# Patient Record
Sex: Female | Born: 1970 | Race: White | Hispanic: No | Marital: Married | Smoking: Never smoker
Health system: Southern US, Community
[De-identification: ages and names within clinical notes are randomized; demographics above are authoritative.]

## PROBLEM LIST (undated history)

## (undated) DIAGNOSIS — R7303 Prediabetes: Secondary | ICD-10-CM

## (undated) DIAGNOSIS — Z975 Presence of (intrauterine) contraceptive device: Secondary | ICD-10-CM

## (undated) DIAGNOSIS — G932 Benign intracranial hypertension: Secondary | ICD-10-CM

## (undated) HISTORY — DX: Presence of (intrauterine) contraceptive device: Z97.5

## (undated) HISTORY — DX: Prediabetes: R73.03

## (undated) HISTORY — PX: ADENOIDECTOMY: SUR15

## (undated) HISTORY — PX: TONSILLECTOMY: SUR1361

---

## 1999-08-25 ENCOUNTER — Other Ambulatory Visit: Admission: RE | Admit: 1999-08-25 | Discharge: 1999-08-25 | Payer: Self-pay | Admitting: Obstetrics and Gynecology

## 2000-01-12 ENCOUNTER — Other Ambulatory Visit: Admission: RE | Admit: 2000-01-12 | Discharge: 2000-01-12 | Payer: Self-pay | Admitting: Obstetrics and Gynecology

## 2000-02-17 ENCOUNTER — Ambulatory Visit (HOSPITAL_COMMUNITY): Admission: RE | Admit: 2000-02-17 | Discharge: 2000-02-17 | Payer: Self-pay | Admitting: *Deleted

## 2000-02-17 ENCOUNTER — Encounter: Payer: Self-pay | Admitting: *Deleted

## 2000-03-04 ENCOUNTER — Inpatient Hospital Stay (HOSPITAL_COMMUNITY): Admission: AD | Admit: 2000-03-04 | Discharge: 2000-03-06 | Payer: Self-pay | Admitting: Obstetrics and Gynecology

## 2000-07-05 ENCOUNTER — Other Ambulatory Visit: Admission: RE | Admit: 2000-07-05 | Discharge: 2000-07-05 | Payer: Self-pay | Admitting: *Deleted

## 2000-07-05 ENCOUNTER — Encounter (INDEPENDENT_AMBULATORY_CARE_PROVIDER_SITE_OTHER): Payer: Self-pay | Admitting: Specialist

## 2001-05-29 ENCOUNTER — Other Ambulatory Visit: Admission: RE | Admit: 2001-05-29 | Discharge: 2001-05-29 | Payer: Self-pay | Admitting: Obstetrics and Gynecology

## 2002-12-17 ENCOUNTER — Other Ambulatory Visit: Admission: RE | Admit: 2002-12-17 | Discharge: 2002-12-17 | Payer: Self-pay | Admitting: Obstetrics and Gynecology

## 2004-02-05 ENCOUNTER — Other Ambulatory Visit: Admission: RE | Admit: 2004-02-05 | Discharge: 2004-02-05 | Payer: Self-pay | Admitting: Obstetrics and Gynecology

## 2005-04-21 ENCOUNTER — Ambulatory Visit: Payer: Self-pay | Admitting: Family Medicine

## 2005-06-08 ENCOUNTER — Ambulatory Visit: Payer: Self-pay | Admitting: Family Medicine

## 2005-06-11 ENCOUNTER — Ambulatory Visit: Payer: Self-pay | Admitting: Family Medicine

## 2005-09-13 ENCOUNTER — Ambulatory Visit: Payer: Self-pay | Admitting: Family Medicine

## 2005-12-31 ENCOUNTER — Ambulatory Visit: Payer: Self-pay | Admitting: Family Medicine

## 2006-06-27 ENCOUNTER — Ambulatory Visit: Payer: Self-pay | Admitting: Family Medicine

## 2007-01-25 ENCOUNTER — Other Ambulatory Visit: Admission: RE | Admit: 2007-01-25 | Discharge: 2007-01-25 | Payer: Self-pay | Admitting: Obstetrics and Gynecology

## 2007-11-08 DIAGNOSIS — F411 Generalized anxiety disorder: Secondary | ICD-10-CM | POA: Insufficient documentation

## 2008-01-31 ENCOUNTER — Other Ambulatory Visit: Admission: RE | Admit: 2008-01-31 | Discharge: 2008-01-31 | Payer: Self-pay | Admitting: Obstetrics and Gynecology

## 2010-06-11 DIAGNOSIS — Z975 Presence of (intrauterine) contraceptive device: Secondary | ICD-10-CM

## 2010-06-11 HISTORY — DX: Presence of (intrauterine) contraceptive device: Z97.5

## 2011-02-26 NOTE — H&P (Signed)
[facility]  Patient:    Kelsey Pope, Kelsey Pope                       MRN: [id number] Adm. Date:  [id number] Attending:  Shaune Spittle Dictator:   Vance Gather Duplantis, C.N.M.                         History and Physical  HISTORY OF PRESENT ILLNESS:       Ms. Chestnutt is a 40 year old, married white female, gravida 2, para 1-0-0-1, at 37-2/7 weeks who presents complaining of contractions every 5 to 10 minutes with uterine irritability in between.  She denies any leaking, bleeding, or vaginal discharge.  She reports positive fetal movement.  She denies any nausea, vomiting, headache, or visual disturbances.  She does report increased back pain and pressure over the last several days and an episode of frequent contractions approximately one week ago.  Her pregnancy has been followed at [facility] by the certified nurse midwife service and has been essentially uncomplicated though at risk for first trimester spotting and a history of abnormal Pap during this pregnancy and received a colposcopy.  OB/GYN HISTORY:                   She is a gravida 2, para 1-0-0-1, who delivered a viable female infant in 1998 who weighed 7 pounds 11 ounces at [redacted] weeks gestation following a 12-hour labor.  She reports that she labored and progressed quickly to complete but required several hours of pushing prior to being delivered.  ALLERGIES:                        CODEINE gives her a feeling of irritability.  PAST MEDICAL HISTORY:             Usual childhood diseases.  She reports occasional urinary tract infections.  PAST SURGICAL HISTORY:            Includes tonsillectomy at age 21 and wisdom teeth removed in high school.  FAMILY HISTORY:                   Significant for maternal grandfather and maternal grandmother with stroke, maternal aunt with varicose veins, mother with thyroid problems, maternal grandmother and maternal aunt with  breast cancer.  GENETIC HISTORY:                  Negative with the exception of the father of the babys cousin with spina bifida and maternal cousin with a "hole in the heart."  SOCIAL HISTORY:                   She is married to [employer] who is involved and supportive.  He is self employed, and she is also employed full time. They are of the [facility] faith.  They deny illicit drug use, alcohol, or smoking with this pregnancy.  PRENATAL LABORATORY DATA:         Blood type is A positive.  Her antibody screen is negative, syphilis nonreactive, rubella immune, hepatitis B surface antigen negative.  TSH have been within normal range throughout her pregnancy. GC and chlamydia are within normal range.  She had low-grade SIL on her first Pap and had a colposcopy during her pregnancy.  Her one-hour glucola was within normal range, and maternal serum alpha-fetoprotein was also within normal range.  Her 36-week beta Strep was positive.  PHYSICAL EXAMINATION:  VITAL SIGNS:                      Stable.  She is afebrile.  HEENT:                            Grossly within normal limits.  HEART:                            Regular rhythm and rate.  CHEST:                            Clear.  BREASTS:                          Soft and nontender.  ABDOMEN:                          Gravid with uterine contractions every 5 to 10 minutes with mild irritability in between.  Fetal heart rate is reactive and reassuring.  PELVIC:                           Her cervix on admission was about 4 cm, 75% vertex, -1, and intact membranes.  EXTREMITIES:                      Within normal limits.  ASSESSMENT:                       1. Intrauterine pregnancy at term.                                   2. Early labor.                                   3. Positive group B Streptococcus.  PLAN:                             Admit to labor and delivery at [facility].  Dr. Dierdre Forth has been notified of  patients admission, and patient is to follow routine CNM orders and receive penicillin for group B Strep prophylaxis. DD:  03/04/00 TD:  03/04/00 Job: [id number]

## 2014-03-29 ENCOUNTER — Other Ambulatory Visit: Payer: Self-pay

## 2014-03-29 DIAGNOSIS — Z1231 Encounter for screening mammogram for malignant neoplasm of breast: Secondary | ICD-10-CM

## 2014-04-11 ENCOUNTER — Ambulatory Visit
Admission: RE | Admit: 2014-04-11 | Discharge: 2014-04-11 | Disposition: A | Discharge status: Home / Self Care | Payer: 59 | Source: Ambulatory Visit

## 2014-04-11 ENCOUNTER — Encounter (INDEPENDENT_AMBULATORY_CARE_PROVIDER_SITE_OTHER): Payer: Self-pay

## 2014-04-11 DIAGNOSIS — Z1231 Encounter for screening mammogram for malignant neoplasm of breast: Secondary | ICD-10-CM

## 2014-04-17 ENCOUNTER — Ambulatory Visit: Payer: Self-pay | Admitting: Gynecology

## 2014-05-17 ENCOUNTER — Encounter: Payer: Self-pay | Admitting: Obstetrics and Gynecology

## 2014-05-20 ENCOUNTER — Ambulatory Visit (INDEPENDENT_AMBULATORY_CARE_PROVIDER_SITE_OTHER): Payer: 59 | Admitting: Gynecology

## 2014-05-20 ENCOUNTER — Encounter: Payer: Self-pay | Admitting: Gynecology

## 2014-05-20 VITALS — BP 110/66 | HR 64 | Resp 18 | Ht 59.75 in | Wt 148.0 lb

## 2014-05-20 DIAGNOSIS — Z01419 Encounter for gynecological examination (general) (routine) without abnormal findings: Secondary | ICD-10-CM

## 2014-05-20 DIAGNOSIS — Z13220 Encounter for screening for lipoid disorders: Secondary | ICD-10-CM

## 2014-05-20 DIAGNOSIS — Z0000 Encounter for general adult medical examination without abnormal findings: Secondary | ICD-10-CM

## 2014-05-20 DIAGNOSIS — Z124 Encounter for screening for malignant neoplasm of cervix: Secondary | ICD-10-CM

## 2014-05-20 LAB — POCT URINALYSIS DIPSTICK
Urobilinogen, UA: NEGATIVE
pH, UA: 5

## 2014-05-20 LAB — HEMOGLOBIN, FINGERSTICK: HEMOGLOBIN, FINGERSTICK: 14 g/dL (ref 12.0–16.0)

## 2014-05-20 NOTE — Progress Notes (Signed)
43 y.o. Married Caucasian female   G2P2002 here for annual exam. Pt is currently sexually active.  Pt has been amenorrheic on Mirena initially placed 2001. Pt reports 58m history of feeling hot at night and weight gain.    Patient's last menstrual period was 04/29/2014.          Sexually active: Yes.    The current method of family planning is IUD.    Exercising: Yes.    walking, aerobics 2x/wk Last pap: 06/01/10 NEG  Alcohol: 2 drinks/wk Tobacco: no BSE: yes Mammogram: 04/16/14 Bi-Rads 1  Hgb: 14.0 ; Urine: Leuks 1  Health Maintenance  Topic Date Due  . Pap Smear  04/06/1988  . Influenza Vaccine  05/11/2014  . Tetanus/tdap  01/24/2017    Family History  Problem Relation Age of Onset  . Breast cancer Maternal Grandmother     w/ menopause  . Breast cancer Maternal Grandmother 48    Patient Active Problem List   Diagnosis Date Noted  . ANXIETY 11/08/2007    Past Medical History  Diagnosis Date  . IUD (intrauterine device) in place 9/11    History reviewed. No pertinent past surgical history.  Allergies: Codeine  Current Outpatient Prescriptions  Medication Sig Dispense Refill  . Ascorbic Acid (VITAMIN C PO) Take by mouth.      . Calcium-Vitamin D-Vitamin K (CHEWABLE CALCIUM PO) Take by mouth.      . doxycycline (ORACEA) 40 MG capsule Take 40 mg by mouth every morning.      Marland Kitchen levonorgestrel (MIRENA) 20 MCG/24HR IUD 1 each by Intrauterine route once.      . Multiple Vitamins-Minerals (MULTIVITAMIN PO) Take by mouth.      . spironolactone (ALDACTONE) 50 MG tablet Take 50 mg by mouth daily.       No current facility-administered medications for this visit.    ROS: Pertinent items are noted in HPI.  Exam:    BP 110/66  Pulse 64  Resp 18  Ht 4' 11.75" (1.518 m)  Wt 148 lb (67.132 kg)  BMI 29.13 kg/m2  LMP 04/29/2014 Weight change: @WEIGHTCHANGE @ Last 3 height recordings:  Ht Readings from Last 3 Encounters:  05/20/14 4' 11.75" (1.518 m)   General appearance:  alert, cooperative and appears stated age Head: Normocephalic, without obvious abnormality, atraumatic Neck: no adenopathy, no carotid bruit, no JVD, supple, symmetrical, trachea midline and thyroid not enlarged, symmetric, no tenderness/mass/nodules Lungs: clear to auscultation bilaterally Breasts: normal appearance, no masses or tenderness Heart: regular rate and rhythm, S1, S2 normal, no murmur, click, rub or gallop Abdomen: soft, non-tender; bowel sounds normal; no masses,  no organomegaly Extremities: extremities normal, atraumatic, no cyanosis or edema Skin: Skin color, texture, turgor normal. No rashes or lesions Lymph nodes: Cervical, supraclavicular, and axillary nodes normal. no inguinal nodes palpated Neurologic: Grossly normal   Pelvic: External genitalia:  no lesions              Urethra: normal appearing urethra with no masses, tenderness or lesions              Bartholins and Skenes: Bartholin's, Urethra, Skene's normal                 Vagina: normal appearing vagina with normal color and discharge, no lesions              Cervix: normal appearance, strings noted              Pap taken: Yes.  Bimanual Exam:  Uterus:  uterus is normal size, shape, consistency and nontender                                      Adnexa:    not indicated and normal adnexa in size, nontender and no masses                                      Rectovaginal: Confirms                                      Anus:  normal sphincter tone, no lesions  1. Routine gynecological examination  counseled on breast self exam, mammography screening, adequate intake of calcium and vitamin D, diet and exercise return annually or prn  2. Laboratory examination ordered as part of a routine general medical examination  - POCT Urinalysis Dipstick - Hemoglobin, fingerstick - Comprehensive metabolic panel; Future  3. Screening for cervical cancer New guidelines reviewed, discussed addition of HPV to pap -  Pap Test with HP (IPS)  4. Screening, lipid Will rto - Lipid panel; Future  5. IUD in place Pt with 3rd IUD, needs to be removed and replaced next year, very happy with no menses An After Visit Summary was printed and given to the patient.

## 2014-05-22 LAB — IPS PAP TEST WITH HPV

## 2014-05-28 ENCOUNTER — Other Ambulatory Visit: Payer: Self-pay | Admitting: Gynecology

## 2014-05-28 ENCOUNTER — Other Ambulatory Visit (INDEPENDENT_AMBULATORY_CARE_PROVIDER_SITE_OTHER): Payer: 59

## 2014-05-28 DIAGNOSIS — Z13220 Encounter for screening for lipoid disorders: Secondary | ICD-10-CM

## 2014-05-28 DIAGNOSIS — Z0000 Encounter for general adult medical examination without abnormal findings: Secondary | ICD-10-CM

## 2014-05-28 LAB — COMPREHENSIVE METABOLIC PANEL
ALK PHOS: 59 U/L (ref 39–117)
ALT: 12 U/L (ref 0–35)
AST: 15 U/L (ref 0–37)
Albumin: 4.4 g/dL (ref 3.5–5.2)
BUN: 16 mg/dL (ref 6–23)
CO2: 27 mEq/L (ref 19–32)
Calcium: 9 mg/dL (ref 8.4–10.5)
Chloride: 103 mEq/L (ref 96–112)
Creat: 0.85 mg/dL (ref 0.50–1.10)
GLUCOSE: 101 mg/dL — AB (ref 70–99)
Potassium: 4 mEq/L (ref 3.5–5.3)
SODIUM: 136 meq/L (ref 135–145)
Total Bilirubin: 0.5 mg/dL (ref 0.2–1.2)
Total Protein: 6.7 g/dL (ref 6.0–8.3)

## 2014-05-28 LAB — LIPID PANEL
Cholesterol: 137 mg/dL (ref 0–200)
HDL: 47 mg/dL (ref >39)
LDL CALC: 81 mg/dL (ref 0–99)
Total CHOL/HDL Ratio: 2.9 Ratio
Triglycerides: 45 mg/dL (ref <150)
VLDL: 9 mg/dL (ref 0–40)

## 2014-05-28 LAB — HEMOGLOBIN A1C
Hgb A1c MFr Bld: 5.7 % — ABNORMAL HIGH (ref <5.7)
MEAN PLASMA GLUCOSE: 117 mg/dL — AB (ref <117)

## 2014-06-04 ENCOUNTER — Other Ambulatory Visit: Payer: 59

## 2014-06-28 ENCOUNTER — Telehealth: Payer: Self-pay | Admitting: Gynecology

## 2014-06-28 NOTE — Telephone Encounter (Signed)
Patient calling to request an RX for "anxiety due to traveling."  [facility] - [place], [place] - [address]

## 2014-06-28 NOTE — Telephone Encounter (Signed)
Routing to Dr. Farrel Gobble, is this a request you can fill for patient?

## 2014-07-01 MED ORDER — ALPRAZOLAM 0.5 MG PO TABS: 0.5000 mg | ORAL_TABLET | Freq: Two times a day (BID) | ORAL | Status: DC | PRN

## 2014-07-01 NOTE — Telephone Encounter (Signed)
Dr. Farrel Gobble has authorized a short term rx for Xanax 0.5 mg po prn bid for travel.  Printed order and awaiting signature from Dr. Farrel Gobble.   Message left to return call to [place] at [phone].   Need to know what pharmacy she would like rx sent to and can fax to that pharmacy.

## 2014-07-02 NOTE — Telephone Encounter (Signed)
Agree, rx signed.

## 2014-07-08 NOTE — Telephone Encounter (Signed)
Spoke with patient. Requested that rx be sent to [facility] in [place].  rx sent at this time, instructions given.  Patient agreeable.  Encounter is closed.

## 2014-08-12 ENCOUNTER — Encounter: Payer: Self-pay | Admitting: Gynecology

## 2014-09-11 ENCOUNTER — Telehealth: Payer: Self-pay

## 2014-09-11 NOTE — Telephone Encounter (Signed)
lmtcb to reschedule AEX with Dr. Lathrop 

## 2015-06-23 ENCOUNTER — Ambulatory Visit: Payer: 59 | Admitting: Gynecology

## 2015-09-18 ENCOUNTER — Telehealth: Payer: Self-pay | Admitting: Obstetrics and Gynecology

## 2015-09-18 MED ORDER — ALPRAZOLAM 0.5 MG PO TABS: 0.5000 mg | ORAL_TABLET | Freq: Two times a day (BID) | ORAL | Status: DC | PRN

## 2015-09-18 NOTE — Telephone Encounter (Signed)
Patient is a former patient of Dr.Lathrop. Was last given rx for Xanax 0.5mg  po prn bid for travel #[id number] [phone] on 07/01/2014 by Dr.Lathrop. Patient is scheduled for upcoming aex with Dr.Jertson on 10/02/2015. Routing to Dr.Jertson for review and advise.

## 2015-09-18 NOTE — Telephone Encounter (Signed)
Patient is leaving on a vacation on Monday, 09/22/15. She said, "I will be flying and in the past I have gotten a prescription for anxiety. I don't remember the name but I am really nervous to fly. Is there any way possible I can get something to help?"  Pharmacy on file is correct.

## 2015-09-18 NOTE — Telephone Encounter (Signed)
Spoke with patient. Advised rx for Xanax 0.5 mg po prn bid #6 [phone] was sent to [facility] on file. Patient is agreeable and verbalizes understanding.  Routing to provider for final review. Patient agreeable to disposition. Will close encounter.

## 2015-09-18 NOTE — Telephone Encounter (Signed)
It's fine to renew her prior script for Xanax, #6, no refills.

## 2015-10-01 ENCOUNTER — Encounter: Payer: Self-pay | Admitting: Obstetrics and Gynecology

## 2015-10-02 ENCOUNTER — Ambulatory Visit: Payer: Self-pay | Admitting: Obstetrics and Gynecology

## 2015-10-30 ENCOUNTER — Ambulatory Visit: Payer: Self-pay | Admitting: Obstetrics and Gynecology

## 2016-07-27 ENCOUNTER — Encounter: Payer: Self-pay | Admitting: Obstetrics and Gynecology

## 2016-07-27 ENCOUNTER — Ambulatory Visit (INDEPENDENT_AMBULATORY_CARE_PROVIDER_SITE_OTHER): Payer: 59 | Admitting: Obstetrics and Gynecology

## 2016-07-27 VITALS — BP 118/78 | HR 80 | Resp 14 | Ht 60.5 in | Wt 155.6 lb

## 2016-07-27 DIAGNOSIS — N941 Unspecified dyspareunia: Secondary | ICD-10-CM | POA: Diagnosis not present

## 2016-07-27 DIAGNOSIS — Z01419 Encounter for gynecological examination (general) (routine) without abnormal findings: Secondary | ICD-10-CM

## 2016-07-27 DIAGNOSIS — Z0000 Encounter for general adult medical examination without abnormal findings: Secondary | ICD-10-CM | POA: Diagnosis not present

## 2016-07-27 DIAGNOSIS — Z124 Encounter for screening for malignant neoplasm of cervix: Secondary | ICD-10-CM

## 2016-07-27 DIAGNOSIS — N951 Menopausal and female climacteric states: Secondary | ICD-10-CM | POA: Diagnosis not present

## 2016-07-27 DIAGNOSIS — R635 Abnormal weight gain: Secondary | ICD-10-CM

## 2016-07-27 DIAGNOSIS — Z30431 Encounter for routine checking of intrauterine contraceptive device: Secondary | ICD-10-CM

## 2016-07-27 DIAGNOSIS — R6882 Decreased libido: Secondary | ICD-10-CM | POA: Diagnosis not present

## 2016-07-27 DIAGNOSIS — Z23 Encounter for immunization: Secondary | ICD-10-CM

## 2016-07-27 DIAGNOSIS — IMO0002 Reserved for concepts with insufficient information to code with codable children: Secondary | ICD-10-CM

## 2016-07-27 DIAGNOSIS — Z30433 Encounter for removal and reinsertion of intrauterine contraceptive device: Secondary | ICD-10-CM

## 2016-07-27 DIAGNOSIS — R1030 Lower abdominal pain, unspecified: Secondary | ICD-10-CM

## 2016-07-27 DIAGNOSIS — F5231 Female orgasmic disorder: Secondary | ICD-10-CM

## 2016-07-27 LAB — COMPREHENSIVE METABOLIC PANEL
ALK PHOS: 66 U/L (ref 33–115)
ALT: 12 U/L (ref 6–29)
AST: 16 U/L (ref 10–35)
Albumin: 4.1 g/dL (ref 3.6–5.1)
BILIRUBIN TOTAL: 0.6 mg/dL (ref 0.2–1.2)
BUN: 13 mg/dL (ref 7–25)
CO2: 25 mmol/L (ref 20–31)
CREATININE: 0.72 mg/dL (ref 0.50–1.10)
Calcium: 9.2 mg/dL (ref 8.6–10.2)
Chloride: 102 mmol/L (ref 98–110)
GLUCOSE: 100 mg/dL — AB (ref 65–99)
POTASSIUM: 4.3 mmol/L (ref 3.5–5.3)
Sodium: 136 mmol/L (ref 135–146)
TOTAL PROTEIN: 6.7 g/dL (ref 6.1–8.1)

## 2016-07-27 LAB — LIPID PANEL
Cholesterol: 142 mg/dL (ref 125–200)
HDL: 49 mg/dL (ref >=46)
LDL Cholesterol: 82 mg/dL (ref <130)
Total CHOL/HDL Ratio: 2.9 Ratio (ref <=5.0)
Triglycerides: 57 mg/dL (ref <150)
VLDL: 11 mg/dL (ref <30)

## 2016-07-27 LAB — CBC
HCT: 40.5 % (ref 35.0–45.0)
Hemoglobin: 13.6 g/dL (ref 11.7–15.5)
MCH: 29.9 pg (ref 27.0–33.0)
MCHC: 33.6 g/dL (ref 32.0–36.0)
MCV: 89 fL (ref 80.0–100.0)
MPV: 9.6 fL (ref 7.5–12.5)
PLATELETS: 346 10*3/uL (ref 140–400)
RBC: 4.55 MIL/uL (ref 3.80–5.10)
RDW: 13.6 % (ref 11.0–15.0)
WBC: 6.8 10*3/uL (ref 3.8–10.8)

## 2016-07-27 LAB — TSH: TSH: 0.56 mIU/L

## 2016-07-27 NOTE — Progress Notes (Signed)
45 y.o. [id number] MarriedCaucasianF here for annual exam.   In the last 6 months she has gained 12 lbs. No changes in diet or exercise. Tries to exercise 3 x a week, she either walks or does a stair climber. Any where from 20-60 minutes of exercise at a time. Some fatigue, no constipation, no change in dry skin.  Period Cycle (Days): 3 Period Pattern:  (pt unsure) Menstrual Flow: Light Menstrual Control: Thin pad Dysmenorrhea: None Only occasional light cycles with the mirena  She c/o some vaginal dryness and pain with intercourse. Intermittent deep dyspareunia. Low libido, harder to have an orgasm. Occasional wakes up during the night hot, no clear hot flashes or night sweats.  She also c/o intermittent random sharp pains that last a few seconds at a time in her BLQ over the last few months. The pain is very lateral in her abdomen and has been occurring about 1 x a week for the last several months. No bowel changes.   Patient's last menstrual period was 06/15/2016.          Sexually active: Yes.    The current method of family planning is IUD. Mirena (due for removal last year) Exercising: Yes.    walking Smoker:  no  Health Maintenance: Pap:  05/20/14 negative, HR HPV negative  History of abnormal Pap:  Yes (long time ago, resolved on it's own) MMG:  04/16/14 BIRADS 1 negative  Colonoscopy:  never BMD:   never TDaP:  01/25/07  Gardasil: never   reports that she has never smoked. She has never used smokeless tobacco. She reports that she drinks about 0.5 oz of alcohol per week . She reports that she does not use drugs. 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 She has a 63 year old son and 48 year old daughter. Daughter is a 2nd year in college.  Past Medical History:  Diagnosis Date  . IUD (intrauterine device) in place 9/11    History reviewed. No pertinent surgical history.  Current Outpatient Prescriptions  Medication Sig Dispense Refill  . Ascorbic Acid (VITAMIN C PO) Take by  mouth.    . Calcium-Vitamin D-Vitamin K (CHEWABLE CALCIUM PO) Take by mouth.    . doxycycline (ORACEA) 40 MG capsule Take 40 mg by mouth every morning.    Marland Kitchen levonorgestrel (MIRENA) 20 MCG/24HR IUD 1 each by Intrauterine route once.    . Multiple Vitamins-Minerals (MULTIVITAMIN PO) Take by mouth.    . ALPRAZolam (XANAX) 0.5 MG tablet Take 1 tablet (0.5 mg total) by mouth 2 (two) times daily as needed for anxiety. (Patient not taking: Reported on 07/27/2016) 6 tablet 0  . spironolactone (ALDACTONE) 50 MG tablet Take 50 mg by mouth daily.     No current facility-administered medications for this visit.     Family History  Problem Relation Age of Onset  . Breast cancer Maternal Aunt 67    w/ menopause  . Breast cancer Maternal Grandmother 50    Review of Systems  Constitutional: Positive for unexpected weight change.  HENT: Negative.   Eyes: Negative.   Respiratory: Negative.   Cardiovascular: Negative.   Gastrointestinal: Positive for abdominal distention.  Endocrine: Positive for heat intolerance.       Some sweating at night  Genitourinary: Positive for pelvic pain.       Pelvic pain during intercourse  Musculoskeletal: Negative.   Skin: Negative.   Allergic/Immunologic: Negative.   Neurological: Negative.   Hematological: Negative.     Exam:  BP 118/78 (BP Location: Right Arm, Patient Position: Sitting, Cuff Size: Normal)   Pulse 80   Resp 14   Ht 5' 0.5" (1.537 m)   Wt 155 lb 9.6 oz (70.6 kg)   LMP 06/15/2016   BMI 29.89 kg/m   Weight change: @WEIGHTCHANGE @ Height:   Height: 5' 0.5" (153.7 cm)  Ht Readings from Last 3 Encounters:  07/27/16 5' 0.5" (1.537 m)  05/20/14 4' 11.75" (1.518 m)    General appearance: alert, cooperative and appears stated age Head: Normocephalic, without obvious abnormality, atraumatic Neck: no adenopathy, supple, symmetrical, trachea midline and thyroid normal to inspection and palpation Lungs: clear to auscultation  bilaterally Breasts: normal appearance, no masses or tenderness Heart: regular rate and rhythm Abdomen: soft, non-tender; bowel sounds normal; no masses,  no organomegaly Extremities: extremities normal, atraumatic, no cyanosis or edema Skin: Skin color, texture, turgor normal. No rashes or lesions Lymph nodes: Cervical, supraclavicular, and axillary nodes normal. No abnormal inguinal nodes palpated Neurologic: Grossly normal   Pelvic: External genitalia:  no lesions              Urethra:  normal appearing urethra with no masses, tenderness or lesions              Bartholins and Skenes: normal                 Vagina: normal appearing vagina with normal color and discharge, no lesions              Cervix: no lesions and IUD string present               Bimanual Exam:  Uterus:  normal size, contour, position, consistency, mobility, non-tender and anteverted              Adnexa: no mass, fullness, tenderness               Rectovaginal: Confirms               Anus:  normal sphincter tone, no lesions Pelvic floor: not tender  Chaperone was present for exam.  A:  Well Woman with normal exam  Weight gain  Contraception, IUD overdue for removal  Dyspareunia, normal exam, try lubrication and changing positions  Orgasmic dysfunction intermittently  Low libido  Abdominal pain, suspect gas pain  P:   Mammogram, recommended 3D (family history)  Discussed breast self exam  Discussed calcium and vit D intake  Pap with reflex hpv (patient desires)  TSH  FSH  Testosterone level  If her FSH is low, she would like another IUD. Discussed option of OCPs. Recommended to abstain from intercourse x 2 weeks then place another IUD  Screening labs  TDAP  Information on low libido given

## 2016-07-28 LAB — VITAMIN D 25 HYDROXY (VIT D DEFICIENCY, FRACTURES): VIT D 25 HYDROXY: 44 ng/mL (ref 30–100)

## 2016-07-28 LAB — FOLLICLE STIMULATING HORMONE: FSH: 14.1 m[IU]/mL

## 2016-07-29 LAB — IPS PAP TEST WITH REFLEX TO HPV

## 2016-07-29 LAB — TESTOS,TOTAL,FREE AND SHBG (FEMALE)
SEX HORMONE BINDING GLOB.: 52 nmol/L (ref 17–124)
TESTOSTERONE,TOTAL,LC/MS/MS: 25 ng/dL (ref 2–45)
Testosterone, Free: 2.4 pg/mL (ref 0.1–6.4)

## 2016-08-11 HISTORY — PX: INTRAUTERINE DEVICE (IUD) INSERTION: SHX5877

## 2016-08-12 ENCOUNTER — Ambulatory Visit (INDEPENDENT_AMBULATORY_CARE_PROVIDER_SITE_OTHER): Payer: 59 | Admitting: Obstetrics and Gynecology

## 2016-08-12 ENCOUNTER — Encounter: Payer: Self-pay | Admitting: Obstetrics and Gynecology

## 2016-08-12 ENCOUNTER — Ambulatory Visit: Payer: 59 | Admitting: Obstetrics and Gynecology

## 2016-08-12 VITALS — BP 120/60 | HR 96 | Resp 16 | Wt 153.0 lb

## 2016-08-12 DIAGNOSIS — F4320 Adjustment disorder, unspecified: Secondary | ICD-10-CM

## 2016-08-12 DIAGNOSIS — Z30433 Encounter for removal and reinsertion of intrauterine contraceptive device: Secondary | ICD-10-CM

## 2016-08-12 DIAGNOSIS — R7309 Other abnormal glucose: Secondary | ICD-10-CM | POA: Diagnosis not present

## 2016-08-12 LAB — HEMOGLOBIN A1C
Hgb A1c MFr Bld: 5.1 % (ref <5.7)
MEAN PLASMA GLUCOSE: 100 mg/dL

## 2016-08-12 LAB — POCT URINE PREGNANCY: Preg Test, Ur: NEGATIVE

## 2016-08-12 MED ORDER — ALPRAZOLAM 0.5 MG PO TABS: 0.5000 mg | ORAL_TABLET | Freq: Two times a day (BID) | ORAL | 0 refills | Status: DC | PRN

## 2016-08-12 NOTE — Progress Notes (Signed)
GYNECOLOGY  VISIT   HPI: 45 y.o.   Married  Caucasian  female   G2P2002 with Patient's last menstrual period was 08/11/2016.   here for IUD Removal and reinsertion     She is also requesting a refill on her Xanax. She uses when she flies and on occasional for sleep.   GYNECOLOGIC HISTORY: Patient's last menstrual period was 08/11/2016. Contraception: abstinence  Menopausal hormone therapy: None        OB History    Gravida Para Term Preterm AB Living   2 2 2     2    SAB TAB Ectopic Multiple Live Births                     Patient Active Problem List   Diagnosis Date Noted  . ANXIETY 11/08/2007    Past Medical History:  Diagnosis Date  . IUD (intrauterine device) in place 9/11    No past surgical history on file.  Current Outpatient Prescriptions  Medication Sig Dispense Refill  . ALPRAZolam (XANAX) 0.5 MG tablet Take 1 tablet (0.5 mg total) by mouth 2 (two) times daily as needed for anxiety. (Patient not taking: Reported on 07/27/2016) 6 tablet 0  . Ascorbic Acid (VITAMIN C PO) Take by mouth.    . Calcium-Vitamin D-Vitamin K (CHEWABLE CALCIUM PO) Take by mouth.    . doxycycline (ORACEA) 40 MG capsule Take 40 mg by mouth every morning.    Marland Kitchen. levonorgestrel (MIRENA) 20 MCG/24HR IUD 1 each by Intrauterine route once.    . Multiple Vitamins-Minerals (MULTIVITAMIN PO) Take by mouth.     No current facility-administered medications for this visit.      ALLERGIES: Codeine  Family History  Problem Relation Age of Onset  . Breast cancer Maternal Aunt 2948    w/ menopause  . Breast cancer Maternal Grandmother 3850    Social History   Social History  . Marital status: Married    Spouse name: N/A  . Number of children: N/A  . Years of education: N/A   Occupational History  . Not on file.   Social History Main Topics  . Smoking status: Never Smoker  . Smokeless tobacco: Never Used  . Alcohol use 0.5 oz/week    1 Standard drinks or equivalent per week  . Drug use:  No  . Sexual activity: Yes    Partners: Male   Other Topics Concern  . Not on file   Social History Narrative  . No narrative on file    Review of Systems  Constitutional: Negative.   HENT: Negative.   Eyes: Negative.   Respiratory: Negative.   Cardiovascular: Negative.   Gastrointestinal: Negative.   Genitourinary: Negative.   Musculoskeletal: Negative.   Skin: Negative.   Neurological: Negative.   Endo/Heme/Allergies: Negative.   Psychiatric/Behavioral: Negative.     PHYSICAL EXAMINATION:    BP 120/60 (BP Location: Right Arm, Patient Position: Sitting, Cuff Size: Normal)   Pulse 96   Resp 16   Wt 153 lb (69.4 kg)   LMP 08/11/2016   BMI 29.39 kg/m     General appearance: alert, cooperative and appears stated age  Pelvic: External genitalia:  no lesions              Urethra:  normal appearing urethra with no masses, tenderness or lesions              Bartholins and Skenes: normal  Vagina: normal appearing vagina with normal color and discharge, no lesions              Cervix: no lesions  The risks of the mirena IUD were reviewed with the patient, including infection, abnormal bleeding and uterine perfortion. Consent was signed.  A speculum was placed in the vagina, the cervix was cleansed with betadine. The old IUD was removed with a ringed forceps. A tenaculum was placed on the cervix, the uterus sounded to 8-9 cm.  The mirena IUD was inserted without difficulty. The string were cut to 3-4 cm. The tenaculum was removed. Slight oozing from the tenaculum site was stopped with pressure.   The patient tolerated the procedure well.   Chaperone was present for exam.  ASSESSMENT IUD removal and reinsertion (mirena) Anxiety Elevated fasting glucose    PLAN F/U in 1 mont for an IUD check Xanax, script of 10 given, she will need to establish care with a primary MD for further scripts HgbA1C today   An After Visit Summary was printed and given to  the patient.

## 2016-08-12 NOTE — Patient Instructions (Signed)

## 2016-09-09 ENCOUNTER — Ambulatory Visit: Payer: 59 | Admitting: Obstetrics and Gynecology

## 2016-09-14 ENCOUNTER — Encounter: Payer: Self-pay | Admitting: Obstetrics and Gynecology

## 2016-09-14 ENCOUNTER — Ambulatory Visit (INDEPENDENT_AMBULATORY_CARE_PROVIDER_SITE_OTHER): Payer: 59 | Admitting: Obstetrics and Gynecology

## 2016-09-14 VITALS — BP 118/70 | HR 84 | Resp 14 | Wt 157.0 lb

## 2016-09-14 DIAGNOSIS — N762 Acute vulvitis: Secondary | ICD-10-CM | POA: Diagnosis not present

## 2016-09-14 DIAGNOSIS — Z30431 Encounter for routine checking of intrauterine contraceptive device: Secondary | ICD-10-CM

## 2016-09-14 NOTE — Progress Notes (Signed)
GYNECOLOGY  VISIT   HPI: 45 y.o.   Married  Caucasian  female   G2P2002 with Patient's last menstrual period was 08/25/2016 (approximate).   here for IUD check. Patient is c/o a 1 week h/o vaginal itching and irritation. Symptoms are mild. No abnormal d/c. She had a mirena  IUD removal and reinsertion last month, here for f/u. No IUD c/o.   GYNECOLOGIC HISTORY: Patient's last menstrual period was 08/25/2016 (approximate). Contraception:IUD (Mirena) Menopausal hormone therapy: none         OB History    Gravida Para Term Preterm AB Living   2 2 2     2    SAB TAB Ectopic Multiple Live Births                     Patient Active Problem List   Diagnosis Date Noted  . ANXIETY 11/08/2007    Past Medical History:  Diagnosis Date  . IUD (intrauterine device) in place 9/11    No past surgical history on file.  Current Outpatient Prescriptions  Medication Sig Dispense Refill  . ALPRAZolam (XANAX) 0.5 MG tablet Take 1 tablet (0.5 mg total) by mouth 2 (two) times daily as needed for anxiety. 10 tablet 0  . Ascorbic Acid (VITAMIN C PO) Take by mouth.    . Calcium-Vitamin D-Vitamin K (CHEWABLE CALCIUM PO) Take by mouth.    . doxycycline (ORACEA) 40 MG capsule Take 40 mg by mouth every morning.    Marland Kitchen. levonorgestrel (MIRENA) 20 MCG/24HR IUD 1 each by Intrauterine route once.    . Multiple Vitamins-Minerals (MULTIVITAMIN PO) Take by mouth.     No current facility-administered medications for this visit.      ALLERGIES: Codeine  Family History  Problem Relation Age of Onset  . Breast cancer Maternal Aunt 2348    w/ menopause  . Breast cancer Maternal Grandmother 7750    Social History   Social History  . Marital status: Married    Spouse name: N/A  . Number of children: N/A  . Years of education: N/A   Occupational History  . Not on file.   Social History Main Topics  . Smoking status: Never Smoker  . Smokeless tobacco: Never Used  . Alcohol use 0.5 oz/week    1 Standard  drinks or equivalent per week  . Drug use: No  . Sexual activity: Yes    Partners: Male   Other Topics Concern  . Not on file   Social History Narrative  . No narrative on file    Review of Systems  Constitutional: Negative.   HENT: Negative.   Eyes: Negative.   Respiratory: Negative.   Cardiovascular: Negative.   Gastrointestinal: Negative.   Genitourinary:       Vaginal itching and irritation   Musculoskeletal: Negative.   Skin: Negative.   Neurological: Negative.   Endo/Heme/Allergies: Negative.   Psychiatric/Behavioral: Negative.     PHYSICAL EXAMINATION:    BP 118/70 (BP Location: Right Arm, Patient Position: Sitting, Cuff Size: Normal)   Pulse 84   Resp 14   Wt 157 lb (71.2 kg)   LMP 08/25/2016 (Approximate)   BMI 30.16 kg/m     General appearance: alert, cooperative and appears stated age   Pelvic: External genitalia:  no lesions              Urethra:  normal appearing urethra with no masses, tenderness or lesions  Bartholins and Skenes: normal                 Vagina: normal appearing vagina with normal color and slight increase in thick white vaginal d/c              Cervix: no lesions and IUD string 3-4 cm              Bimanual Exam:  Uterus:  normal size, contour, position, consistency, mobility, non-tender              Adnexa: no mass, fullness, tenderness              Chaperone was present for exam.  Wet prep: no clue, no trich, + wbc KOH: no yeast PH: 4   ASSESSMENT IUD check, doing well Vulvitis, negative slides    PLAN Wet prep probe sent Discussed vulvar skin care Vaseline externally as needed   An After Visit Summary was printed and given to the patient.

## 2016-09-15 LAB — WET PREP BY MOLECULAR PROBE
Candida species: NEGATIVE
Gardnerella vaginalis: NEGATIVE
TRICHOMONAS VAG: NEGATIVE

## 2017-07-29 ENCOUNTER — Telehealth: Payer: Self-pay | Admitting: Allergy and Immunology

## 2017-07-29 NOTE — Telephone Encounter (Signed)
Please call pt back regarding concern for a letter received. Thanks

## 2017-07-29 NOTE — Telephone Encounter (Signed)
Pt rec'd the letter from [facility] - I advised her that as long as she continues to made monthly pmts, it will not get turned over to collections - kt

## 2017-08-03 ENCOUNTER — Other Ambulatory Visit (HOSPITAL_COMMUNITY)
Admission: RE | Admit: 2017-08-03 | Discharge: 2017-08-03 | Disposition: A | Discharge status: Home / Self Care | Payer: 59 | Source: Ambulatory Visit | Attending: Obstetrics and Gynecology | Admitting: Obstetrics and Gynecology

## 2017-08-03 ENCOUNTER — Ambulatory Visit (INDEPENDENT_AMBULATORY_CARE_PROVIDER_SITE_OTHER): Payer: 59 | Admitting: Obstetrics and Gynecology

## 2017-08-03 ENCOUNTER — Encounter: Payer: Self-pay | Admitting: Obstetrics and Gynecology

## 2017-08-03 VITALS — BP 110/60 | HR 80 | Resp 14 | Ht 60.0 in | Wt 159.0 lb

## 2017-08-03 DIAGNOSIS — Z01419 Encounter for gynecological examination (general) (routine) without abnormal findings: Secondary | ICD-10-CM

## 2017-08-03 DIAGNOSIS — E663 Overweight: Secondary | ICD-10-CM

## 2017-08-03 DIAGNOSIS — L853 Xerosis cutis: Secondary | ICD-10-CM

## 2017-08-03 DIAGNOSIS — Z124 Encounter for screening for malignant neoplasm of cervix: Secondary | ICD-10-CM

## 2017-08-03 DIAGNOSIS — R5383 Other fatigue: Secondary | ICD-10-CM

## 2017-08-03 DIAGNOSIS — L680 Hirsutism: Secondary | ICD-10-CM

## 2017-08-03 DIAGNOSIS — R7303 Prediabetes: Secondary | ICD-10-CM | POA: Insufficient documentation

## 2017-08-03 DIAGNOSIS — Z0000 Encounter for general adult medical examination without abnormal findings: Secondary | ICD-10-CM

## 2017-08-03 DIAGNOSIS — Z30431 Encounter for routine checking of intrauterine contraceptive device: Secondary | ICD-10-CM

## 2017-08-03 NOTE — Progress Notes (Signed)
46 y.o. [id number] MarriedCaucasianF here for annual exam.  She has a mirena IUD, placed in 11/17. No regular cycles.  Sexually active, she has occasional deep dyspareunia, positional.  She recently started having some hair growth on her chin in the last year. She has recently started to shave. No other new hair growth.  Her dentist told her her thyroid was enlarged on panoramic x-ray.  She gained about 10 lbs, has lost about 6. Very difficult to loose weight. She is trying to eat healthier, is watching her steps. Is trying to walk on the treadmill. She c/o dry skin and fatigue.  Period Duration (Days): no cycle since 7/18  Period Pattern: (!) Irregular Menstrual Flow: Light Menstrual Control: Thin pad  Patient's last menstrual period was 04/13/2017.          Sexually active: Yes.    The current method of family planning is IUD ( Mirena).    Exercising: Yes.    walking Smoker:  no  Health Maintenance: Pap:  07/27/16: WNL, 05-20-14 WNL NEG HR HPV History of abnormal Pap:  Yes - resolved on it's own.  MMG:  04-11-14 WNL  Colonoscopy:  Never BMD:   Never TDaP:  07-27-16 Gardasil: N/A   reports that she has never smoked. She has never used smokeless tobacco. She reports that she drinks about 0.5 oz of alcohol per week . She reports that she does not use drugs. 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 She has a 598 year old son and 47 year old daughter. Daughter is a 4th year in college.  Past Medical History:  Diagnosis Date  . IUD (intrauterine device) in place 9/11    Past Surgical History:  Procedure Laterality Date  . INTRAUTERINE DEVICE (IUD) INSERTION  08/2016   Mirena     Current Outpatient Prescriptions  Medication Sig Dispense Refill  . ALPRAZolam (XANAX) 0.5 MG tablet Take 1 tablet (0.5 mg total) by mouth 2 (two) times daily as needed for anxiety. 10 tablet 0  . Ascorbic Acid (VITAMIN C PO) Take by mouth.    . Calcium-Vitamin D-Vitamin K (CHEWABLE CALCIUM PO) Take by mouth.     . doxycycline (ORACEA) 40 MG capsule Take 40 mg by mouth every morning.    Marland Kitchen. levonorgestrel (MIRENA) 20 MCG/24HR IUD 1 each by Intrauterine route once.    . Multiple Vitamins-Minerals (MULTIVITAMIN PO) Take by mouth.     No current facility-administered medications for this visit.   Intermittently on doxycycline for rosacea   Family History  Problem Relation Age of Onset  . Breast cancer Maternal Aunt 1748       w/ menopause  . Breast cancer Maternal Grandmother 50    Review of Systems  Constitutional:       Weight gain   HENT: Negative.   Eyes: Negative.   Respiratory: Negative.   Cardiovascular: Negative.   Gastrointestinal: Negative.   Endocrine: Negative.   Genitourinary: Negative.   Musculoskeletal:       Tenderness under left arm  Skin:       Hair growth on chin  Allergic/Immunologic: Negative.   Neurological: Negative.   Psychiatric/Behavioral: Negative.     Exam:   BP 110/60 (BP Location: Right Arm, Patient Position: Sitting, Cuff Size: Normal)   Pulse 80   Resp 14   Ht 5' (1.524 m)   Wt 159 lb (72.1 kg)   LMP 04/13/2017   BMI 31.05 kg/m   Weight change: @WEIGHTCHANGE @ Height:   Height:  5' (152.4 cm)  Ht Readings from Last 3 Encounters:  08/03/17 5' (1.524 m)  07/27/16 5' 0.5" (1.537 m)  05/20/14 4' 11.75" (1.518 m)    General appearance: alert, cooperative and appears stated age Head: Normocephalic, without obvious abnormality, atraumatic Neck: no adenopathy, supple, symmetrical, trachea midline and thyroid normal to inspection and palpation Lungs: clear to auscultation bilaterally Cardiovascular: regular rate and rhythm Breasts: normal appearance, no masses or tenderness Abdomen: soft, non-tender; non distended,  no masses,  no organomegaly Extremities: extremities normal, atraumatic, no cyanosis or edema Skin: Skin color, texture, turgor normal. No rashes or lesions Lymph nodes: Cervical, supraclavicular, and axillary nodes normal. No abnormal  inguinal nodes palpated Neurologic: Grossly normal   Pelvic: External genitalia:  no lesions              Urethra:  normal appearing urethra with no masses, tenderness or lesions              Bartholins and Skenes: normal                 Vagina: normal appearing vagina with normal color and discharge, no lesions              Cervix: no lesions and IUD string 2 cm               Bimanual Exam:  Uterus:  normal size, contour, position, consistency, mobility, non-tender and anteverted              Adnexa: no mass, fullness, tenderness               Rectovaginal: Confirms               Anus:  normal sphincter tone, no lesions  Chaperone was present for exam.  A:  Well Woman with normal exam  IUD check  Hirsutism  Dry skin, fatigue  Pre-diabetes  Overweight, we discussed eating healthy, exercise  P:   Pap with hpv  Screening labs, hirsutism labs, TSH  Mammogram strongly encouraged, she will schedule, recommended 3D  Discussed breast self exam  Discussed calcium and vit D intake  Information given on weight loss clinics

## 2017-08-05 LAB — CYTOLOGY - PAP
DIAGNOSIS: NEGATIVE
HPV: NOT DETECTED

## 2017-08-06 LAB — CBC
HEMATOCRIT: 43 % (ref 34.0–46.6)
Hemoglobin: 14.3 g/dL (ref 11.1–15.9)
MCH: 29.5 pg (ref 26.6–33.0)
MCHC: 33.3 g/dL (ref 31.5–35.7)
MCV: 89 fL (ref 79–97)
PLATELETS: 350 10*3/uL (ref 150–379)
RBC: 4.84 x10E6/uL (ref 3.77–5.28)
RDW: 13.2 % (ref 12.3–15.4)
WBC: 6.8 10*3/uL (ref 3.4–10.8)

## 2017-08-06 LAB — COMPREHENSIVE METABOLIC PANEL
A/G RATIO: 1.7 (ref 1.2–2.2)
ALK PHOS: 68 IU/L (ref 39–117)
ALT: 14 IU/L (ref 0–32)
AST: 18 IU/L (ref 0–40)
Albumin: 4.5 g/dL (ref 3.5–5.5)
BUN/Creatinine Ratio: 9 (ref 9–23)
BUN: 8 mg/dL (ref 6–24)
Bilirubin Total: 0.4 mg/dL (ref 0.0–1.2)
CALCIUM: 9.8 mg/dL (ref 8.7–10.2)
CO2: 23 mmol/L (ref 20–29)
CREATININE: 0.89 mg/dL (ref 0.57–1.00)
Chloride: 102 mmol/L (ref 96–106)
GFR calc Af Amer: 89 mL/min/{1.73_m2} (ref >59)
GFR, EST NON AFRICAN AMERICAN: 77 mL/min/{1.73_m2} (ref >59)
Globulin, Total: 2.6 g/dL (ref 1.5–4.5)
Glucose: 102 mg/dL — ABNORMAL HIGH (ref 65–99)
Potassium: 4.4 mmol/L (ref 3.5–5.2)
Sodium: 141 mmol/L (ref 134–144)
Total Protein: 7.1 g/dL (ref 6.0–8.5)

## 2017-08-06 LAB — LIPID PANEL
CHOLESTEROL TOTAL: 137 mg/dL (ref 100–199)
Chol/HDL Ratio: 2.9 ratio (ref 0.0–4.4)
HDL: 47 mg/dL (ref >39)
LDL Calculated: 75 mg/dL (ref 0–99)
Triglycerides: 76 mg/dL (ref 0–149)
VLDL CHOLESTEROL CAL: 15 mg/dL (ref 5–40)

## 2017-08-06 LAB — DHEA-SULFATE: DHEA-SO4: 155.3 ug/dL (ref 41.2–243.7)

## 2017-08-06 LAB — TESTT+TESTF+SHBG
Sex Hormone Binding: 95.5 nmol/L (ref 24.6–122.0)
TESTOSTERONE, TOTAL: 28.6 ng/dL
Testosterone, Free: 1.8 pg/mL (ref 0.0–4.2)

## 2017-08-06 LAB — HEMOGLOBIN A1C
ESTIMATED AVERAGE GLUCOSE: 111 mg/dL
HEMOGLOBIN A1C: 5.5 % (ref 4.8–5.6)

## 2017-08-06 LAB — TSH: TSH: 0.645 u[IU]/mL (ref 0.450–4.500)

## 2017-08-06 LAB — 17-HYDROXYPROGESTERONE: 17-Hydroxyprogesterone: 210 ng/dL

## 2018-08-24 ENCOUNTER — Ambulatory Visit: Payer: 59 | Admitting: Obstetrics and Gynecology

## 2018-09-11 NOTE — Progress Notes (Deleted)
47 y.o. 572P2002 Married White or Caucasian Not Hispanic or Latino female here for annual exam.      No LMP recorded.          Sexually active: {yes no:314532}  The current method of family planning is {contraception:315051}.    Exercising: {yes no:314532}  {types:19826} Smoker:  {YES [id number]:22349}  Health Maintenance: Pap:  08/03/2017 neg with neg HR HPV, 07/27/16: WNL History of abnormal Pap:  Yes  MMG:  04-11-14 WNL  Colonoscopy:  Never BMD:   Never TDaP:  07-27-16 Gardasil: N/A   reports that she has never smoked. She has never used smokeless tobacco. She reports that she drinks about 1.0 standard drinks of alcohol per week. She reports that she does not use drugs.  Past Medical History:  Diagnosis Date  . IUD (intrauterine device) in place 9/11  . Prediabetes     Past Surgical History:  Procedure Laterality Date  . INTRAUTERINE DEVICE (IUD) INSERTION  08/2016   Mirena     Current Outpatient Medications  Medication Sig Dispense Refill  . ALPRAZolam (XANAX) 0.5 MG tablet Take 1 tablet (0.5 mg total) by mouth 2 (two) times daily as needed for anxiety. 10 tablet 0  . Ascorbic Acid (VITAMIN C PO) Take by mouth.    . Calcium-Vitamin D-Vitamin K (CHEWABLE CALCIUM PO) Take by mouth.    . doxycycline (ORACEA) 40 MG capsule Take 40 mg by mouth every morning.    Marland Kitchen. levonorgestrel (MIRENA) 20 MCG/24HR IUD 1 each by Intrauterine route once.    . Multiple Vitamins-Minerals (MULTIVITAMIN PO) Take by mouth.     No current facility-administered medications for this visit.     Family History  Problem Relation Age of Onset  . Breast cancer Maternal Aunt 2948       w/ menopause  . Breast cancer Maternal Grandmother 50    Review of Systems  Exam:   There were no vitals taken for this visit.  Weight change: @WEIGHTCHANGE @ Height:      Ht Readings from Last 3 Encounters:  08/03/17 5' (1.524 m)  07/27/16 5' 0.5" (1.537 m)  05/20/14 4' 11.75" (1.518 m)    General appearance: alert,  cooperative and appears stated age Head: Normocephalic, without obvious abnormality, atraumatic Neck: no adenopathy, supple, symmetrical, trachea midline and thyroid {CHL AMB PHY EX THYROID NORM DEFAULT:[phone]::"normal to inspection and palpation"} Lungs: clear to auscultation bilaterally Cardiovascular: regular rate and rhythm Breasts: {Exam; breast:13139::"normal appearance, no masses or tenderness"} Abdomen: soft, non-tender; non distended,  no masses,  no organomegaly Extremities: extremities normal, atraumatic, no cyanosis or edema Skin: Skin color, texture, turgor normal. No rashes or lesions Lymph nodes: Cervical, supraclavicular, and axillary nodes normal. No abnormal inguinal nodes palpated Neurologic: Grossly normal   Pelvic: External genitalia:  no lesions              Urethra:  normal appearing urethra with no masses, tenderness or lesions              Bartholins and Skenes: normal                 Vagina: normal appearing vagina with normal color and discharge, no lesions              Cervix: {CHL AMB PHY EX CERVIX NORM DEFAULT:[id number]::"no lesions"}               Bimanual Exam:  Uterus:  {CHL AMB PHY EX UTERUS NORM DEFAULT:[phone]::"normal size, contour, position, consistency, mobility, non-tender"}  Adnexa: {CHL AMB PHY EX ADNEXA NO MASS DEFAULT:[phone]::"no mass, fullness, tenderness"}               Rectovaginal: Confirms               Anus:  normal sphincter tone, no lesions  Chaperone was present for exam.  A:  Well Woman with normal exam  P:     '

## 2018-09-14 ENCOUNTER — Ambulatory Visit: Payer: 59 | Admitting: Obstetrics and Gynecology

## 2019-09-13 ENCOUNTER — Ambulatory Visit
Admission: EM | Admit: 2019-09-13 | Discharge: 2019-09-13 | Disposition: A | Discharge status: Home / Self Care | Payer: 59 | Attending: Family Medicine | Admitting: Family Medicine

## 2019-09-13 ENCOUNTER — Encounter: Payer: Self-pay | Admitting: Emergency Medicine

## 2019-09-13 ENCOUNTER — Other Ambulatory Visit: Payer: Self-pay

## 2019-09-13 DIAGNOSIS — Z20828 Contact with and (suspected) exposure to other viral communicable diseases: Secondary | ICD-10-CM | POA: Diagnosis not present

## 2019-09-13 NOTE — Discharge Instructions (Signed)
Your Covid test is negative.   Keep taking the medicines over-the-counter as needed for symptoms Rest stay hydrated and follow-up if any continued or worsening problems

## 2019-09-13 NOTE — ED Triage Notes (Signed)
Pt states Monday her dad tested positive for covid. States she started feeling bad 3 days ago. Pt c/o weakness, burning in the back part of her throat. Last contact with her dad on 11/29

## 2019-09-13 NOTE — ED Provider Notes (Signed)
[name]    CSN: [id number] Arrival date & time: 09/13/19  1428      History   Chief Complaint Chief Complaint  Patient presents with  . Sore Throat    HPI Kelsey Pope is a 48 y.o. female.   Patient is a 48 year old female presents today with headache, sinus pressure, body aches, sore throat.  Symptoms have been constant, waxing waning for 3 days.  She has been taking ibuprofen and antihistamine for symptoms.  This seems to somewhat help.  Concerned because her dad tested Covid positive and was in contact with him on 11/29.  Denies any fever, chills, loss of taste or smell or diarrhea.  ROS per HPI      Past Medical History:  Diagnosis Date  . IUD (intrauterine device) in place 9/11  . Prediabetes     Patient Active Problem List   Diagnosis Date Noted  . Prediabetes   . ANXIETY 11/08/2007    Past Surgical History:  Procedure Laterality Date  . INTRAUTERINE DEVICE (IUD) INSERTION  08/2016   Mirena     OB History    Gravida  2   Para  2   Term  2   Preterm      AB      Living  2     SAB      TAB      Ectopic      Multiple      Live Births               Home Medications    Prior to Admission medications   Medication Sig Start Date End Date Taking? Authorizing Provider  Ascorbic Acid (VITAMIN C PO) Take by mouth.    [provider]  Calcium-Vitamin D-Vitamin K (CHEWABLE CALCIUM PO) Take by mouth.    [provider]  levonorgestrel (MIRENA) 20 MCG/24HR IUD 1 each by Intrauterine route once.    [provider]  Multiple Vitamins-Minerals (MULTIVITAMIN PO) Take by mouth.    [provider]    Family History Family History  Problem Relation Age of Onset  . Breast cancer Maternal Aunt 41       w/ menopause  . Breast cancer Maternal Grandmother 84    Social History Social History   Tobacco Use  . Smoking status: Never Smoker  . Smokeless tobacco: Never Used  Substance Use  Topics  . Alcohol use: Yes    Alcohol/week: 1.0 standard drinks    Types: 1 Standard drinks or equivalent per week  . Drug use: No     Allergies   Codeine   Review of Systems Review of Systems   Physical Exam Triage Vital Signs ED Triage Vitals  Enc Vitals Group     BP 09/13/19 1437 (!) 135/94     Pulse Rate 09/13/19 1437 80     Resp 09/13/19 1437 16     Temp 09/13/19 1437 (!) 97.4 F (36.3 C)     Temp src --      SpO2 09/13/19 1437 98 %     Weight --      Height --      Head Circumference --      Peak Flow --      Pain Score 09/13/19 1434 0     Pain Loc --      Pain Edu? --      Excl. in GC? --    No data found.  Updated Vital  Signs BP (!) 135/94   Pulse 80   Temp (!) 97.4 F (36.3 C)   Resp 16   SpO2 98%   Visual Acuity Right Eye Distance:   Left Eye Distance:   Bilateral Distance:    Right Eye Near:   Left Eye Near:    Bilateral Near:     Physical Exam Vitals signs and nursing note reviewed.  Constitutional:      General: She is not in acute distress.    Appearance: She is well-developed. She is not ill-appearing, toxic-appearing or diaphoretic.  HENT:     Head: Normocephalic and atraumatic.     Right Ear: Tympanic membrane and ear canal normal.     Left Ear: Tympanic membrane and ear canal normal.     Mouth/Throat:     Pharynx: Oropharynx is clear. Uvula midline. No posterior oropharyngeal erythema.     Tonsils: No tonsillar exudate or tonsillar abscesses. 0 on the right. 0 on the left.  Eyes:     Conjunctiva/sclera: Conjunctivae normal.  Neck:     Musculoskeletal: Neck supple.  Cardiovascular:     Rate and Rhythm: Normal rate and regular rhythm.     Heart sounds: No murmur.  Pulmonary:     Effort: Pulmonary effort is normal. No respiratory distress.     Breath sounds: Normal breath sounds.  Skin:    General: Skin is warm and dry.  Neurological:     Mental Status: She is alert.  Psychiatric:        Mood and Affect: Mood normal.       UC Treatments / Results  Labs (all labs ordered are listed, but only abnormal results are displayed) Labs Reviewed  POC SARS CORONAVIRUS 2 AG -  ED    EKG   Radiology No results found.  Procedures Procedures (including critical care time)  Medications Ordered in UC Medications - No data to display  Initial Impression / Assessment and Plan / UC Course  I have reviewed the triage vital signs and the nursing notes.  Pertinent labs & imaging results that were available during my care of the patient were reviewed by me and considered in my medical decision making (see chart for details).     Exposure to Covid-rapid test negative We will have her rest, monitor symptoms and follow-up as needed Final Clinical Impressions(s) / UC Diagnoses   Final diagnoses:  Exposure to SARS virus     Discharge Instructions     Your Covid test is negative.   Keep taking the medicines over-the-counter as needed for symptoms Rest stay hydrated and follow-up if any continued or worsening problems    ED Prescriptions    None     PDMP not reviewed this encounter.   Orvan July, NP 09/13/19 1544

## 2021-02-05 ENCOUNTER — Encounter: Payer: Self-pay | Admitting: Neurology

## 2021-02-11 NOTE — Progress Notes (Signed)
NEUROLOGY CONSULTATION NOTE  Kelsey Pope MRN: [id number] DOB: 1971-06-09  Referring provider: Billie Ruddy, OD Primary care provider: No PCP  Reason for consult:  Blurred vision  Assessment/Plan:   1.  Bilateral papilledema  1.  MRI of brain and orbits with and without contrast  2.  Pending results of imaging, consider lumbar puncture. 3.  Will refer to Dr. Wynell Balloon at [facility] for further evaluation such as HVF testing. 4.  In anticipation of starting a medication (such as acetazolamide), will check baseline CBC and CMP 5.  If IIH confirmed, would recommend removing the IUD. 6.  Otherwise, follow up 6 months.     Subjective:  Kelsey Pope is a 50 year old right-handed female who presents for evaluation of idiopathic intracranial hypertension.  History supplemented by referring provider's note.  In March, she started noticing some fuzzy vision in her eyes (right worse than left) when she would wake up in the morning.  Vision improved within an hour of getting up.  She has history of occasional headaches (moderate bi-occipital/frontal pressure with photophobia and phonophobia) which increased.  However, she was found to have elevated blood pressure.  She was started on HCTZ and headaches improved.  She also notes some mild pulsatile tinnitus and sometimes high-pitched ringing.  She had an optometry exam on 01/22/2021 which demonstrated bilateral papilledema.  She reports about a 15 lb weight gain since January.  She does have a Mirena.   PAST MEDICAL HISTORY: Past Medical History:  Diagnosis Date  . IUD (intrauterine device) in place 9/11  . Prediabetes     PAST SURGICAL HISTORY: Past Surgical History:  Procedure Laterality Date  . INTRAUTERINE DEVICE (IUD) INSERTION  08/2016   Mirena     MEDICATIONS: Current Outpatient Medications on File Prior to Visit  Medication Sig Dispense Refill  . Ascorbic Acid (VITAMIN C PO) Take by mouth.    .  Calcium-Vitamin D-Vitamin K (CHEWABLE CALCIUM PO) Take by mouth.    . levonorgestrel (MIRENA) 20 MCG/24HR IUD 1 each by Intrauterine route once.    . Multiple Vitamins-Minerals (MULTIVITAMIN PO) Take by mouth.     No current facility-administered medications on file prior to visit.    ALLERGIES: Allergies  Allergen Reactions  . Codeine     REACTION: hyper    FAMILY HISTORY: Family History  Problem Relation Age of Onset  . Breast cancer Maternal Aunt 24       w/ menopause  . Breast cancer Maternal Grandmother 50    Objective:  Blood pressure (!) 152/88, pulse 77, height 5' (1.524 m), weight 163 lb 9.6 oz (74.2 kg), SpO2 99 %. General: No acute distress.  Patient appears well-groomed.   Head:  Normocephalic/atraumatic Eyes:  fundi examined but not visualized Neck: supple, no paraspinal tenderness, full range of motion Back: No paraspinal tenderness Heart: regular rate and rhythm Lungs: Clear to auscultation bilaterally. Vascular: No carotid bruits. Neurological Exam: Mental status: alert and oriented to person, place, and time, recent and remote memory intact, fund of knowledge intact, attention and concentration intact, speech fluent and not dysarthric, language intact. Cranial nerves: CN I: not tested CN II: pupils equal, round and reactive to light, visual fields intact CN III, IV, VI:  full range of motion, no nystagmus, no ptosis CN V: facial sensation intact. CN VII: upper and lower face symmetric CN VIII: hearing intact CN IX, X: gag intact, uvula midline CN XI: sternocleidomastoid and trapezius muscles intact CN XII: tongue  midline Bulk & Tone: normal, no fasciculations. Motor:  muscle strength 5/5 throughout Sensation:  Pinprick, temperature and vibratory sensation intact. Deep Tendon Reflexes:  2+ throughout,  toes downgoing.   Finger to nose testing:  Without dysmetria.   Heel to shin:  Without dysmetria.   Gait:  Normal station and stride.  Romberg  negative.    Thank you for allowing me to take part in the care of this patient.  Shon Millet, DO  CC: Billie Ruddy, OD

## 2021-02-13 ENCOUNTER — Other Ambulatory Visit (INDEPENDENT_AMBULATORY_CARE_PROVIDER_SITE_OTHER): Payer: 59

## 2021-02-13 ENCOUNTER — Ambulatory Visit (INDEPENDENT_AMBULATORY_CARE_PROVIDER_SITE_OTHER): Payer: 59 | Admitting: Neurology

## 2021-02-13 ENCOUNTER — Other Ambulatory Visit: Payer: Self-pay

## 2021-02-13 ENCOUNTER — Encounter: Payer: Self-pay | Admitting: Neurology

## 2021-02-13 VITALS — BP 152/88 | HR 77 | Ht 60.0 in | Wt 163.6 lb

## 2021-02-13 DIAGNOSIS — H4710 Unspecified papilledema: Secondary | ICD-10-CM

## 2021-02-13 LAB — COMPREHENSIVE METABOLIC PANEL
ALT: 16 U/L (ref 0–35)
AST: 23 U/L (ref 0–37)
Albumin: 4.4 g/dL (ref 3.5–5.2)
Alkaline Phosphatase: 62 U/L (ref 39–117)
BUN: 13 mg/dL (ref 6–23)
CO2: 30 mEq/L (ref 19–32)
Calcium: 9.8 mg/dL (ref 8.4–10.5)
Chloride: 102 mEq/L (ref 96–112)
Creatinine, Ser: 0.79 mg/dL (ref 0.40–1.20)
GFR: 86.95 mL/min (ref >60.00)
Glucose, Bld: 104 mg/dL — ABNORMAL HIGH (ref 70–99)
Potassium: 4.2 mEq/L (ref 3.5–5.1)
Sodium: 139 mEq/L (ref 135–145)
Total Bilirubin: 0.6 mg/dL (ref 0.2–1.2)
Total Protein: 7.3 g/dL (ref 6.0–8.3)

## 2021-02-13 LAB — CBC
HCT: 42.7 % (ref 36.0–46.0)
Hemoglobin: 14.4 g/dL (ref 12.0–15.0)
MCHC: 33.7 g/dL (ref 30.0–36.0)
MCV: 87.7 fl (ref 78.0–100.0)
Platelets: 343 10*3/uL (ref 150.0–400.0)
RBC: 4.87 Mil/uL (ref 3.87–5.11)
RDW: 13.9 % (ref 11.5–15.5)
WBC: 6.7 10*3/uL (ref 4.0–10.5)

## 2021-02-13 NOTE — Patient Instructions (Addendum)
There is evidence of increase pressure behind your eyes which may indicate idiopathic intracranial hypertension which means there is excess spinal fluid in your skull (usually not caused by anything specific)  1.  First we will check MRI of brain with and without contrast and MRV of head with and without contrast 2.  Pending results, the next step would typically be a spinal tap to measure the pressure 3.  Further recommendations pending results. 4.  In anticipation of starting a possible medication, I want to check a baseline CBC and CMP. 5.  Refer to Dr. Alben Spittle at [facility] for further evaluation and visual field testing.

## 2021-02-13 NOTE — Addendum Note (Signed)
Addended by: Leida Lauth on: 02/13/2021 02:32 PM   Modules accepted: Orders

## 2021-02-19 NOTE — Progress Notes (Signed)
No PA required

## 2021-03-02 ENCOUNTER — Ambulatory Visit
Admission: RE | Admit: 2021-03-02 | Discharge: 2021-03-02 | Disposition: A | Discharge status: Home / Self Care | Payer: 59 | Source: Ambulatory Visit | Attending: Neurology | Admitting: Neurology

## 2021-03-02 ENCOUNTER — Other Ambulatory Visit: Payer: Self-pay

## 2021-03-02 DIAGNOSIS — H4710 Unspecified papilledema: Secondary | ICD-10-CM

## 2021-03-02 MED ORDER — GADOBENATE DIMEGLUMINE 529 MG/ML IV SOLN
15.0000 mL | Freq: Once | INTRAVENOUS | Status: AC | PRN
  Administered 2021-03-02: 15 mL via INTRAVENOUS

## 2021-03-03 ENCOUNTER — Telehealth: Payer: Self-pay | Admitting: Neurology

## 2021-03-03 NOTE — Telephone Encounter (Signed)
OV notes faxed through Epic to referral provider. Per the last ov note pt referral provider was cc on the notes.

## 2021-03-03 NOTE — Telephone Encounter (Signed)
The referring office called and left a message requesting visit notes to be faxed from the patient's referral visit from Dr. Billie Ruddy on 02/13/21 with Dr. Everlena Cooper.  Fax: [phone]

## 2021-03-06 ENCOUNTER — Telehealth: Payer: Self-pay

## 2021-03-06 DIAGNOSIS — H4710 Unspecified papilledema: Secondary | ICD-10-CM

## 2021-03-06 NOTE — Telephone Encounter (Signed)
-----   Message from Drema Dallas, DO sent at 03/03/2021  1:20 PM EDT ----- MRI and MRV of head is overall unremarkable - no evidence of tumor or blood clot.  For papilledema, I would like to proceed with lumbar puncture to assess opening pressure as well as CSF cel count, protein, glucose, cytology and gram stain and culture.

## 2021-03-10 NOTE — Telephone Encounter (Signed)
Per Dr.Jaffe order placed order placed on 03/06/21. Had to reorder imaging today noticed extra labs on order we do not need.

## 2021-03-10 NOTE — Addendum Note (Signed)
Addended by: Leida Lauth on: 03/10/2021 07:49 AM   Modules accepted: Orders

## 2021-04-02 ENCOUNTER — Other Ambulatory Visit: Payer: 59

## 2021-04-16 ENCOUNTER — Ambulatory Visit
Admission: RE | Admit: 2021-04-16 | Discharge: 2021-04-16 | Disposition: A | Discharge status: Home / Self Care | Payer: 59 | Source: Ambulatory Visit | Attending: Neurology | Admitting: Neurology

## 2021-04-16 ENCOUNTER — Ambulatory Visit: Payer: 59 | Admitting: Neurology

## 2021-04-16 ENCOUNTER — Other Ambulatory Visit (HOSPITAL_COMMUNITY)
Admission: RE | Admit: 2021-04-16 | Discharge: 2021-04-16 | Disposition: A | Discharge status: Home / Self Care | Payer: 59 | Source: Ambulatory Visit | Attending: Anesthesiology | Admitting: Anesthesiology

## 2021-04-16 ENCOUNTER — Other Ambulatory Visit: Payer: Self-pay

## 2021-04-16 VITALS — BP 136/97 | HR 73

## 2021-04-16 DIAGNOSIS — H4710 Unspecified papilledema: Secondary | ICD-10-CM

## 2021-04-16 MED ORDER — DIAZEPAM 5 MG PO TABS
5.0000 mg | ORAL_TABLET | Freq: Once | ORAL | Status: AC
  Administered 2021-04-16: 5 mg via ORAL

## 2021-04-16 NOTE — Progress Notes (Signed)
Blood drawn from pts LAC for LP lab work. 1 tube collected. 1 successful attempt. Pt tolerated well. Gauze and tape applied after.

## 2021-04-16 NOTE — Discharge Instructions (Signed)

## 2021-04-16 NOTE — Progress Notes (Signed)
LMOVM to call office back

## 2021-04-17 ENCOUNTER — Telehealth: Payer: Self-pay | Admitting: Neurology

## 2021-04-17 LAB — CYTOLOGY - NON PAP

## 2021-04-20 ENCOUNTER — Telehealth: Payer: Self-pay | Admitting: Neurology

## 2021-04-20 DIAGNOSIS — G971 Other reaction to spinal and lumbar puncture: Secondary | ICD-10-CM

## 2021-04-20 DIAGNOSIS — H4710 Unspecified papilledema: Secondary | ICD-10-CM

## 2021-04-20 LAB — CSF CELL COUNT WITH DIFFERENTIAL
RBC Count, CSF: 1 cells/uL — ABNORMAL HIGH
WBC, CSF: 1 cells/uL (ref 0–5)

## 2021-04-20 LAB — CNS IGG SYNTHESIS RATE, CSF+BLOOD
Albumin Serum: 3.9 g/dL (ref 3.5–5.2)
Albumin, CSF: 10.4 mg/dL (ref 8.0–42.0)
CNS-IgG Synthesis Rate: -3.3 mg/24 h (ref <=3.3)
IgG (Immunoglobin G), Serum: 932 mg/dL (ref 600–1640)
IgG Total CSF: 1.2 mg/dL (ref 0.8–7.7)
IgG-Index: 0.48 (ref <0.66)

## 2021-04-20 LAB — CSF CULTURE W GRAM STAIN
GRAM STAIN:: NONE SEEN
MICRO NUMBER:: 12091629
Result:: NO GROWTH
SPECIMEN QUALITY:: ADEQUATE

## 2021-04-20 LAB — GLUCOSE, CSF: Glucose, CSF: 64 mg/dL (ref 40–80)

## 2021-04-20 LAB — MYELIN BASIC PROTEIN, CSF: Myelin Basic Protein: <2 mcg/L (ref 2.0–4.0)

## 2021-04-20 LAB — PROTEIN, CSF: Total Protein, CSF: 25 mg/dL (ref 15–45)

## 2021-04-20 NOTE — Telephone Encounter (Signed)
Blood Patch ordered.

## 2021-04-20 NOTE — Telephone Encounter (Signed)
Had to order Blood Patch for pt. Pt complained of a headache she had since her LP on 04/16/21.  Order sent to [facility] imaging. Order to be signed by dr.Jaffe.

## 2021-04-20 NOTE — Telephone Encounter (Signed)
Pt is returning call to sheena. [phone]

## 2021-04-20 NOTE — Telephone Encounter (Signed)
LP Results: Spinal tap revealed normal pressure (not elevated).  I believe that we referred patient to Dr.Weaver at [facility] - recommend seeing him todetermine if she does have papilledema.

## 2021-04-20 NOTE — Telephone Encounter (Signed)
Pt called in, has been laying flat since Thursday. If she gets up, it feels like her head is going to explode. The spinal center wont do anything until they hear from [facility]. Pt would like a call back [phone]

## 2021-04-20 NOTE — Telephone Encounter (Signed)
Tried calling pt no answer. LMoVm to call the office back.

## 2021-04-20 NOTE — Progress Notes (Signed)
LMOVM for pt to call the office back.

## 2021-04-21 ENCOUNTER — Other Ambulatory Visit: Payer: Self-pay

## 2021-04-21 ENCOUNTER — Ambulatory Visit
Admission: RE | Admit: 2021-04-21 | Discharge: 2021-04-21 | Disposition: A | Discharge status: Home / Self Care | Payer: 59 | Source: Ambulatory Visit | Attending: Neurology | Admitting: Neurology

## 2021-04-21 DIAGNOSIS — G971 Other reaction to spinal and lumbar puncture: Secondary | ICD-10-CM

## 2021-04-21 MED ORDER — IOPAMIDOL (ISOVUE-M 200) INJECTION 41%
1.0000 mL | Freq: Once | INTRAMUSCULAR | Status: AC
  Administered 2021-04-21: 1 mL via EPIDURAL

## 2021-04-21 NOTE — Discharge Instructions (Signed)

## 2021-04-21 NOTE — Progress Notes (Signed)
20 cc of blood drawn from pts LAC for blood patch. Pt tolerated well. Gauze and tape applied after

## 2021-04-28 ENCOUNTER — Telehealth: Payer: Self-pay | Admitting: Neurology

## 2021-04-28 DIAGNOSIS — Z79899 Other long term (current) drug therapy: Secondary | ICD-10-CM

## 2021-04-28 NOTE — Telephone Encounter (Signed)
Received notes from Dr. Alben Spittle, her ophthalmologist.  Swelling behind the eyes are significantly improved but still present.  The spinal tap may have been the reason that the swelling has gone down.  But since there is still some evidence of swelling (with the potential of increasing), I would like to start acetazolamide ER 500mg  twice daily - this medication will help keep the intracranial pressure low.  She should be aware that numbness and tingling may be a side effect, so she shouldn't worry if she experiences this.

## 2021-04-29 ENCOUNTER — Other Ambulatory Visit: Payer: Self-pay | Admitting: Neurology

## 2021-04-29 MED ORDER — ACETAZOLAMIDE ER 500 MG PO CP12: 500.0000 mg | ORAL_CAPSULE | Freq: Two times a day (BID) | ORAL | 5 refills | Status: DC

## 2021-04-29 NOTE — Telephone Encounter (Signed)
Tried to call patient but VM is full so unable to leave message.

## 2021-04-29 NOTE — Addendum Note (Signed)
Addended by: Tawnya Crook on: 04/29/2021 12:44 PM   Modules accepted: Orders

## 2021-04-29 NOTE — Telephone Encounter (Signed)
Spoken to patient and notified Dr Moises Blood comments.   Patient stated that she is willing to take acetazolamide ER 500 mg but wanted to know if will interact any with hctz.  Also patient asked how long will she have to be on this medication. She was told by Dr Alben Spittle that it would be temporary.

## 2021-04-29 NOTE — Telephone Encounter (Signed)
What Dx do I use for the BMP?

## 2021-04-29 NOTE — Telephone Encounter (Signed)
Spoken to patient and notified Dr Jaffe's  comments. Verbalized understanding.   

## 2021-04-29 NOTE — Addendum Note (Signed)
Addended by: Tawnya Crook on: 04/29/2021 12:22 PM   Modules accepted: Orders

## 2021-04-29 NOTE — Addendum Note (Signed)
Addended by: Tawnya Crook on: 04/29/2021 11:22 AM   Modules accepted: Orders

## 2021-04-29 NOTE — Addendum Note (Signed)
Addended by: Tawnya Crook on: 04/29/2021 12:45 PM   Modules accepted: Orders

## 2021-04-30 NOTE — Addendum Note (Signed)
Addended by: Tawnya Crook on: 04/30/2021 07:32 AM   Modules accepted: Orders

## 2021-06-04 ENCOUNTER — Other Ambulatory Visit (INDEPENDENT_AMBULATORY_CARE_PROVIDER_SITE_OTHER): Payer: 59

## 2021-06-04 ENCOUNTER — Other Ambulatory Visit: Payer: Self-pay

## 2021-06-04 DIAGNOSIS — Z79899 Other long term (current) drug therapy: Secondary | ICD-10-CM

## 2021-06-04 LAB — BASIC METABOLIC PANEL
BUN: 13 mg/dL (ref 6–23)
CO2: 21 mEq/L (ref 19–32)
Calcium: 9.4 mg/dL (ref 8.4–10.5)
Chloride: 110 mEq/L (ref 96–112)
Creatinine, Ser: 0.89 mg/dL (ref 0.40–1.20)
GFR: 75.2 mL/min (ref >60.00)
Glucose, Bld: 95 mg/dL (ref 70–99)
Potassium: 3.9 mEq/L (ref 3.5–5.1)
Sodium: 139 mEq/L (ref 135–145)

## 2021-08-18 NOTE — Progress Notes (Signed)
NEUROLOGY FOLLOW UP OFFICE NOTE  Kelsey Pope [id number]  Assessment/Plan:   Idiopathic increased intracranial pressure - resolved.  Now off of acetazolamide  If she becomes symptomatic, she should have a repeat eye exam, otherwise plan to have a repeat eye exam in about 4 months.  If exam is indicative of papilledema, she is to contact me.    Subjective:  Kelsey Pope is a 50 year old right-handed female who follows up for possible intracranial hypertension.  UPDATE: MRI of brain with and without contrast and MRV of head on 03/02/2021 personally reviewed showed partially empty sella with bilateral distal transverse sinus narrowing but no mass lesion or sinus thrombosis.  She underwent LP on 04/16/2021 which demonstrated an opening pressure of 20 cm H2O.  CSF analysis was unremarkable.  She saw ophthalmologist, Dr. Alben Spittle, on 04/28/2021 which showed significantly improved OCT RNFL with mild disc edema on the left and normal on the right. Repeat evaluation on 06/24/2021 showed resolved disc edema and VF defect on left.  Headaches resolved and acetazolamide was discontinued over the next week.  She is feeling well.  No headache, change in vision or ringing in her ears.  A couple of times over the past 2 months, she noted hearing a "heart beat" in her head.     HISTORY: In March, she started noticing some fuzzy vision in her eyes (right worse than left) when she would wake up in the morning.  Vision improved within an hour of getting up.  She has history of occasional headaches (moderate bi-occipital/frontal pressure with photophobia and phonophobia) which increased.  However, she was found to have elevated blood pressure.  She was started on HCTZ and headaches improved.  She also notes some mild pulsatile tinnitus and sometimes high-pitched ringing.  She had an optometry exam on 01/22/2021 which demonstrated bilateral papilledema.  She reports about a 15 lb weight gain since January.  She does have  a Mirena.  PAST MEDICAL HISTORY: Past Medical History:  Diagnosis Date   IUD (intrauterine device) in place 9/11   Prediabetes     MEDICATIONS: Current Outpatient Medications on File Prior to Visit  Medication Sig Dispense Refill   acetaZOLAMIDE ER (DIAMOX) 500 MG capsule Take 1 capsule (500 mg total) by mouth 2 (two) times daily. 60 capsule 5   Ascorbic Acid (VITAMIN C PO) Take by mouth.     Calcium-Vitamin D-Vitamin K (CHEWABLE CALCIUM PO) Take by mouth.     hydrochlorothiazide (HYDRODIURIL) 12.5 MG tablet Take 12.5 mg by mouth daily.     levonorgestrel (MIRENA) 20 MCG/24HR IUD 1 each by Intrauterine route once.     Multiple Vitamins-Minerals (MULTIVITAMIN PO) Take by mouth.     Vitamin D, Ergocalciferol, (DRISDOL) 1.25 MG (50000 UNIT) CAPS capsule Take 1 capsule by mouth once a week.     No current facility-administered medications on file prior to visit.    ALLERGIES: Allergies  Allergen Reactions   Codeine     REACTION: hyper    FAMILY HISTORY: Family History  Problem Relation Age of Onset   Breast cancer Maternal Aunt 9       w/ menopause   Breast cancer Maternal Grandmother 50      Objective:  Blood pressure (!) 144/96, pulse 73, height 5' (1.524 m), weight 160 lb 3.2 oz (72.7 kg), SpO2 98 %. General: No acute distress.  Patient appears well-groomed.   Head:  Normocephalic/atraumatic Eyes:  Fundi examined but not visualized Neck: supple, no  paraspinal tenderness, full range of motion Heart:  Regular rate and rhythm Lungs:  Clear to auscultation bilaterally Back: No paraspinal tenderness Neurological Exam: alert and oriented to person, place, and time.  Speech fluent and not dysarthric, language intact.  CN II-XII intact. Bulk and tone normal, muscle strength 5/5 throughout.  Sensation to light touch intact.  Deep tendon reflexes 2+ throughout, toes downgoing.  Finger to nose testing intact.  Gait normal, Romberg negative.   Shon Millet, DO

## 2021-08-19 ENCOUNTER — Ambulatory Visit (INDEPENDENT_AMBULATORY_CARE_PROVIDER_SITE_OTHER): Payer: 59 | Admitting: Neurology

## 2021-08-19 ENCOUNTER — Other Ambulatory Visit: Payer: Self-pay

## 2021-08-19 ENCOUNTER — Encounter: Payer: Self-pay | Admitting: Neurology

## 2021-08-19 VITALS — BP 144/96 | HR 73 | Ht 60.0 in | Wt 160.2 lb

## 2021-08-19 DIAGNOSIS — G932 Benign intracranial hypertension: Secondary | ICD-10-CM

## 2021-08-19 NOTE — Patient Instructions (Signed)
Follow up for repeat eye exam in March.  If exam suggests recurrence of increased intracranial pressure, contact me.  Have the notes sent over to my office

## 2022-08-29 IMAGING — MR MR MRV HEAD W/O CM
2 series · 22 of 48 positions shown · IV contrast (multihance)
Comparison: None.

CLINICAL DATA: Headache, papilledema.  Ringing in right ear.

EXAM:
MRI HEAD WITHOUT AND WITH CONTRAST
MRV HEAD WITHOUT CONTRAST
TECHNIQUE: Multiplanar, multiecho pulse sequences of the brain and surrounding
structures were obtained without and with intravenous contrast.
Angiographic images of the intracranial venous structures were
obtained using MRV technique without intravenous contrast.
CONTRAST:  15mL MULTIHANCE GADOBENATE DIMEGLUMINE 529 MG/ML IV SOLN

[Series 2: (id) coronal · coronal · 3.0mm · 0.49mm/px · 13 of 90 slices shown]
[im 1/90]
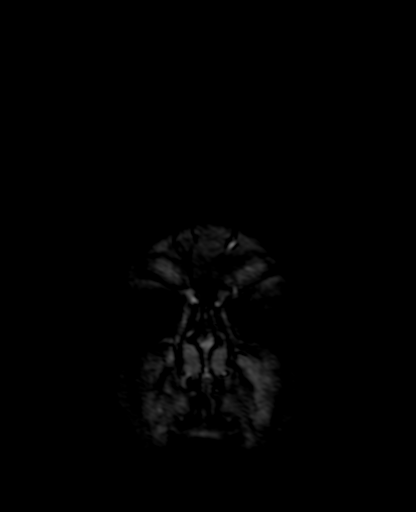
[im 5/90]
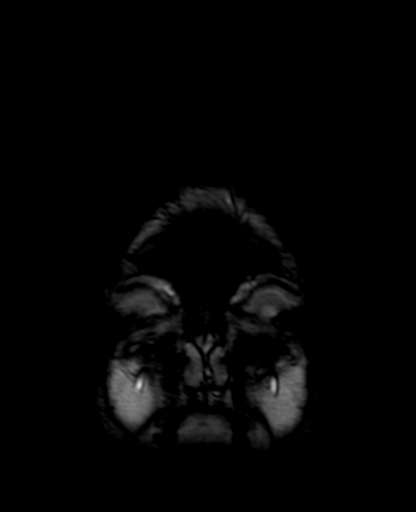
[im 9/90]
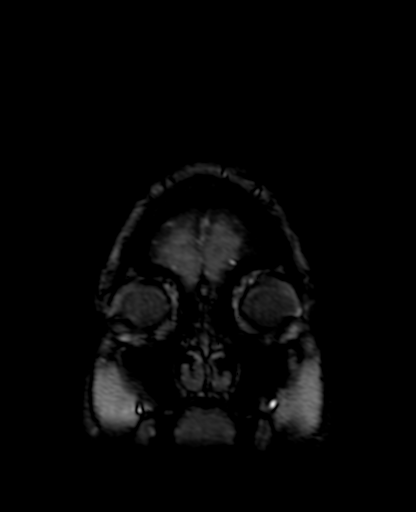
[im 13/90]
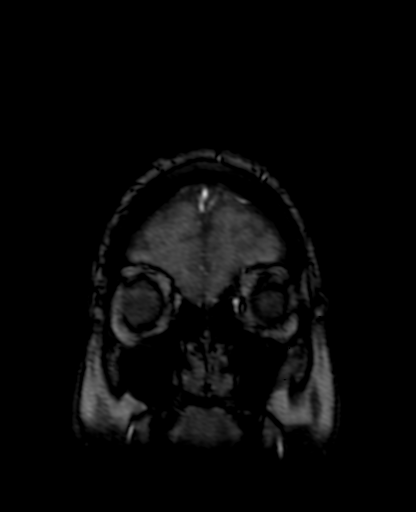
[im 17/90]
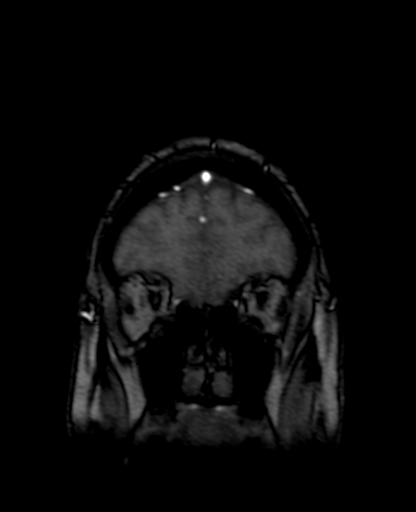
[im 26/90]
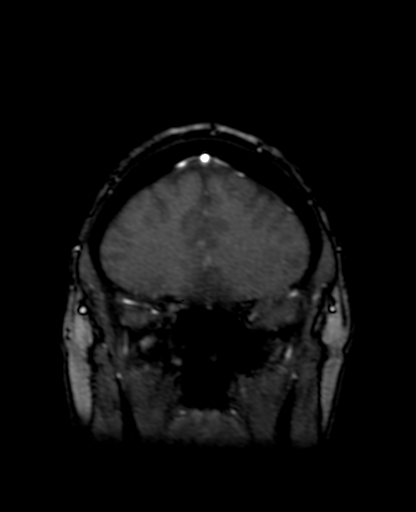
[im 39/90]
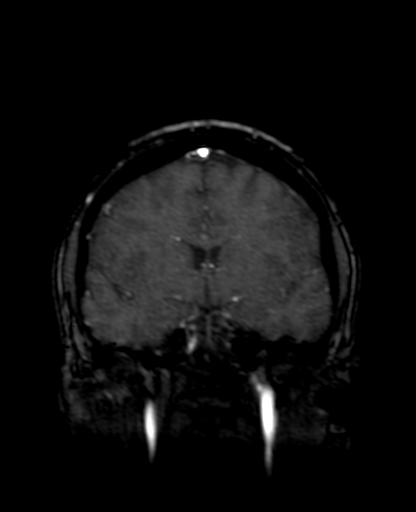
[im 47/90]
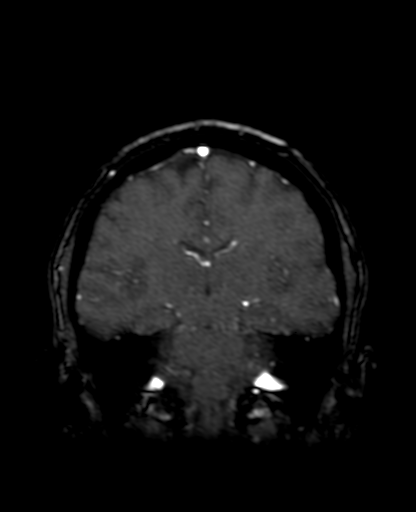
[im 51/90]
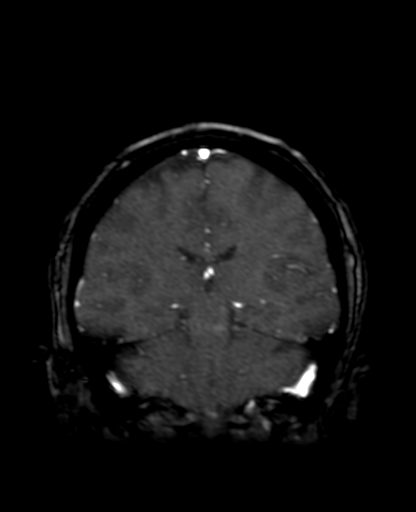
[im 64/90]
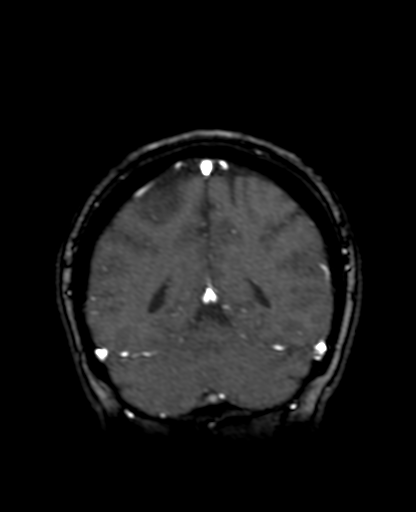
[im 73/90]
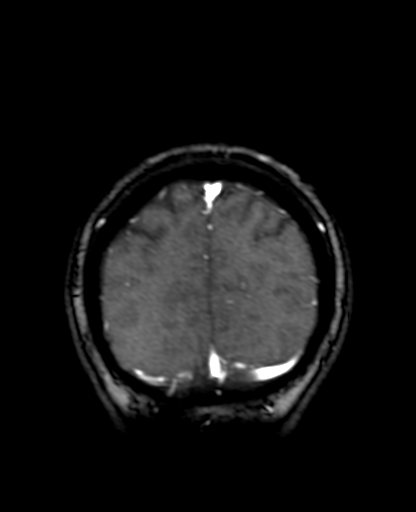
[im 77/90]
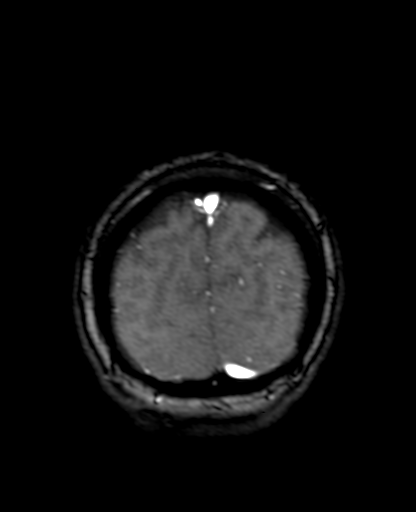
[im 85/90]
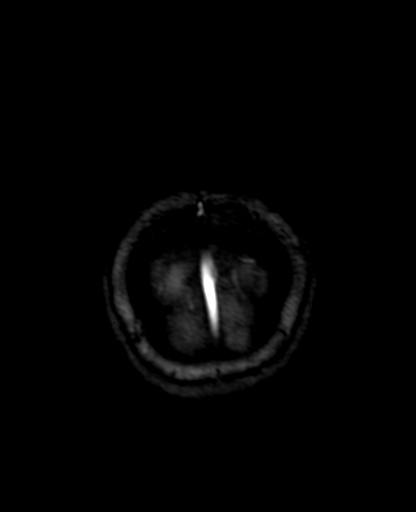

[Series 6: tof_3d cor · coronal · 1.5mm · 0.43mm/px · 9 of 109 slices shown]
[im 5/109]
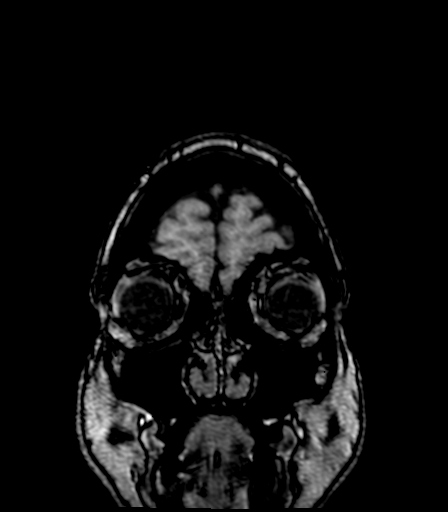
[im 18/109]
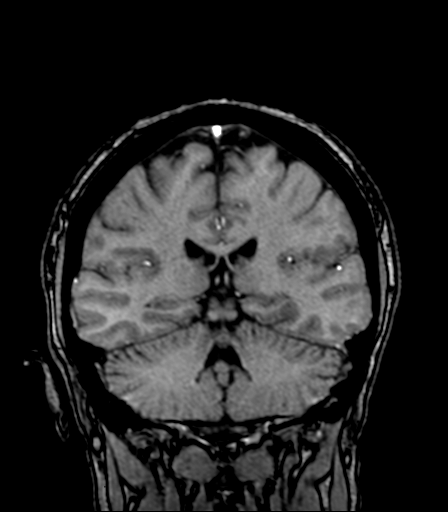
[im 35/109]
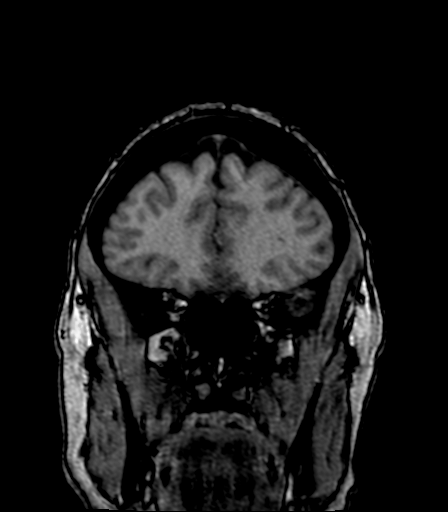
[im 48/109]
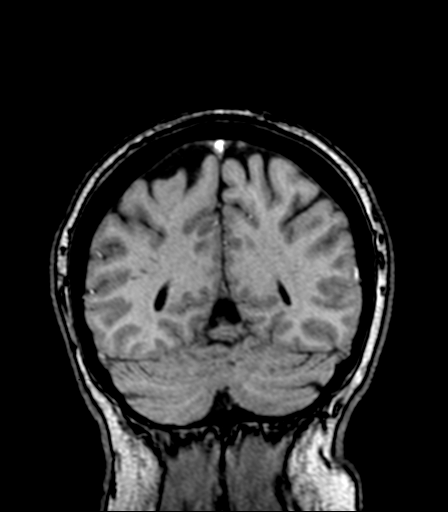
[im 57/109]
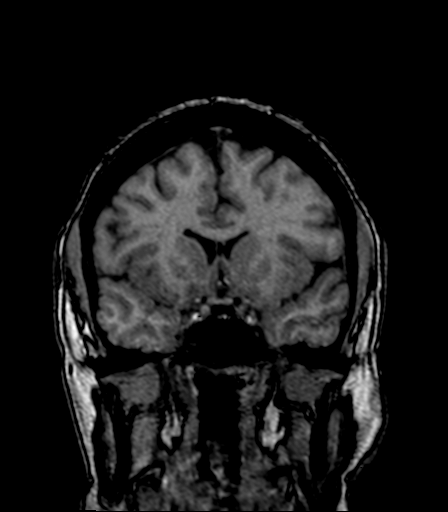
[im 61/109]
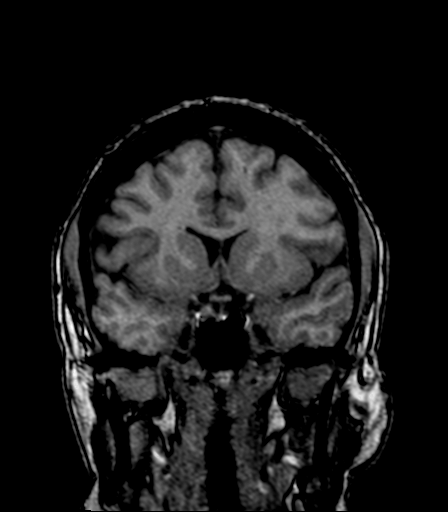
[im 74/109]
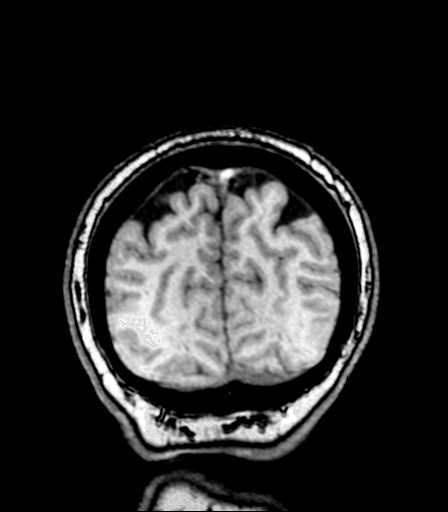
[im 91/109]
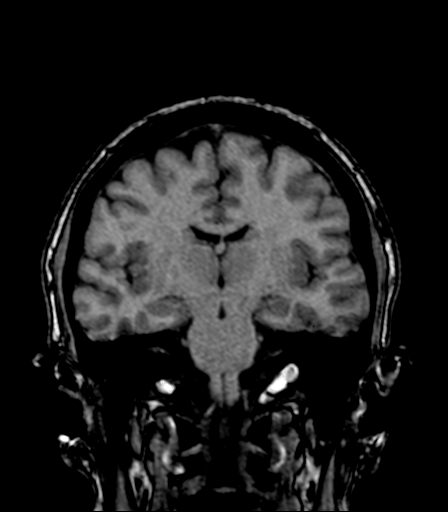
[im 104/109]
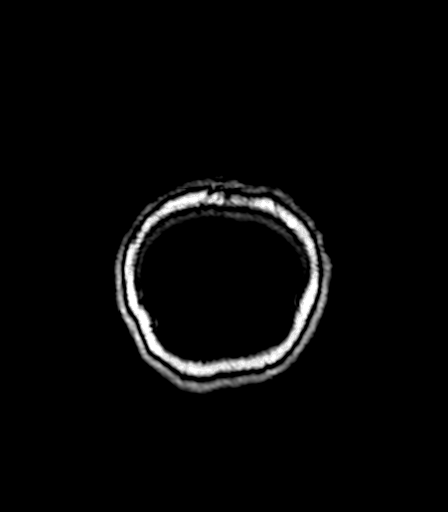

[22 of 48 positions shown; findings below may reference images not displayed]

FINDINGS: MRI head:

Brain: No acute infarct. No edema. No hydrocephalus. No mass lesion
or abnormal mass effect. No extra-axial fluid collection. No acute
hemorrhage. No abnormal enhancement. Normal position of the
cerebellar tonsils. Partially empty sella.

Vascular: Major arterial flow voids are maintained at the skull
base. See below for evaluation of the dural venous sinuses.

Skull and upper cervical spine: Normal marrow signal.

Sinuses/orbits: Mild ethmoid air cell mucosal thickening without
air-fluid levels. No appreciable optic nerve sheath dilation. No
specific evidence of papilledema by MRI (no visible flattening of
the posterior sclera or protrusion of the optic nerve head).

Other: Trace right mastoid effusion.

MRV:

Evaluation is made using the postcontrast MRI sequences and the MRV.

Rounded filling defects in the distal transverse sinus bilaterally
are favored to reflect arachnoid granulations and more linear
bilateral filling defects in the region of the transverse sinus
stenoses are favored to relate to turbulent flow due to the
narrowing, particularly given some signal loss on the pre contrast
T1 in these areas. The remainder of the dural venous sinuses are
patent.
IMPRESSION: 1. Partially empty sella with narrowing of bilateral distal
transverse sinuses. While these findings are nonspecific, they can
be seen with idiopathic intracranial hypertension which can cause
headaches, papilledema and tinnitus. A lumbar puncture with opening
pressure could further evaluate.
2. While evaluation in the areas of the transverse sinus stenosis is
limited, no clear/definite dural sinus thrombosis. Rounded filling
defects in the distal transverse sinuses bilaterally are favored to
reflect arachnoid granulations and more linear bilateral filling
defects in the region of the stenoses are favored to relate to
turbulent flow due to the narrowing. The remainder of the dural
venous sinuses are patent.

## 2022-10-18 IMAGING — XA Imaging study
4 series · 4 of 4 positions shown · non-contrast
Comparison: none

CLINICAL DATA: Spinal headache after lumbar puncture.

[Series 1: ortho standard · 1 of 1 slices shown (1 of 4)]
[im 1/1]
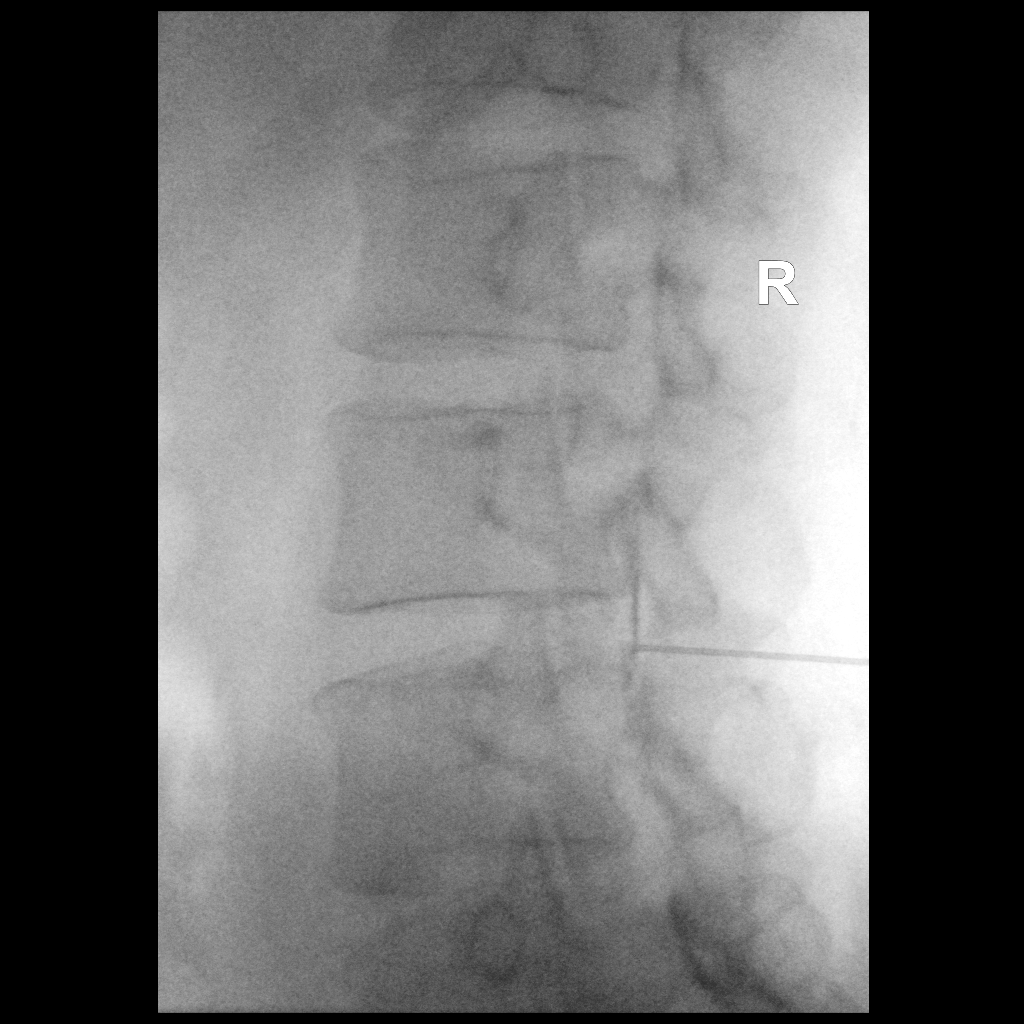

[Series 2: ortho standard · 1 of 1 slices shown (2 of 4)]
[im 1/1]
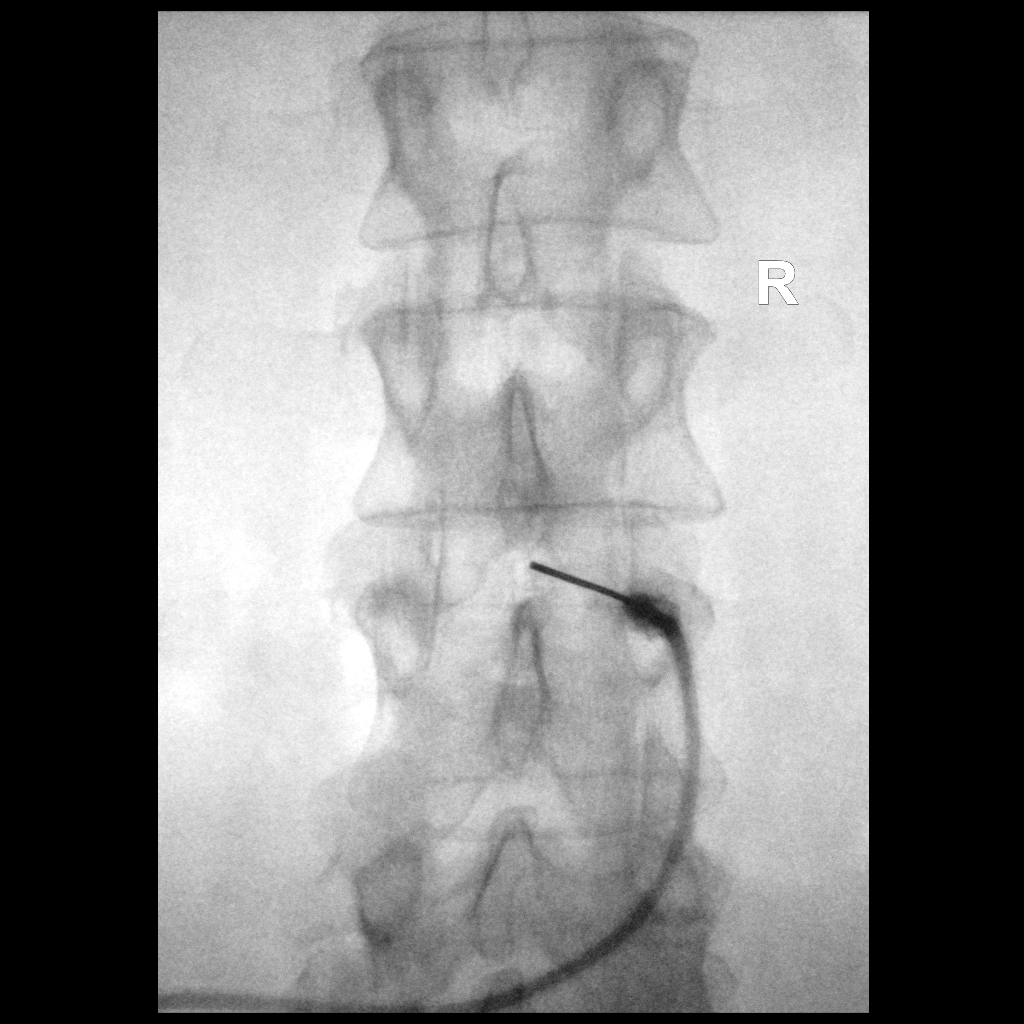

[Series 3: ortho standard · 1 of 1 slices shown (3 of 4)]
[im 1/1]
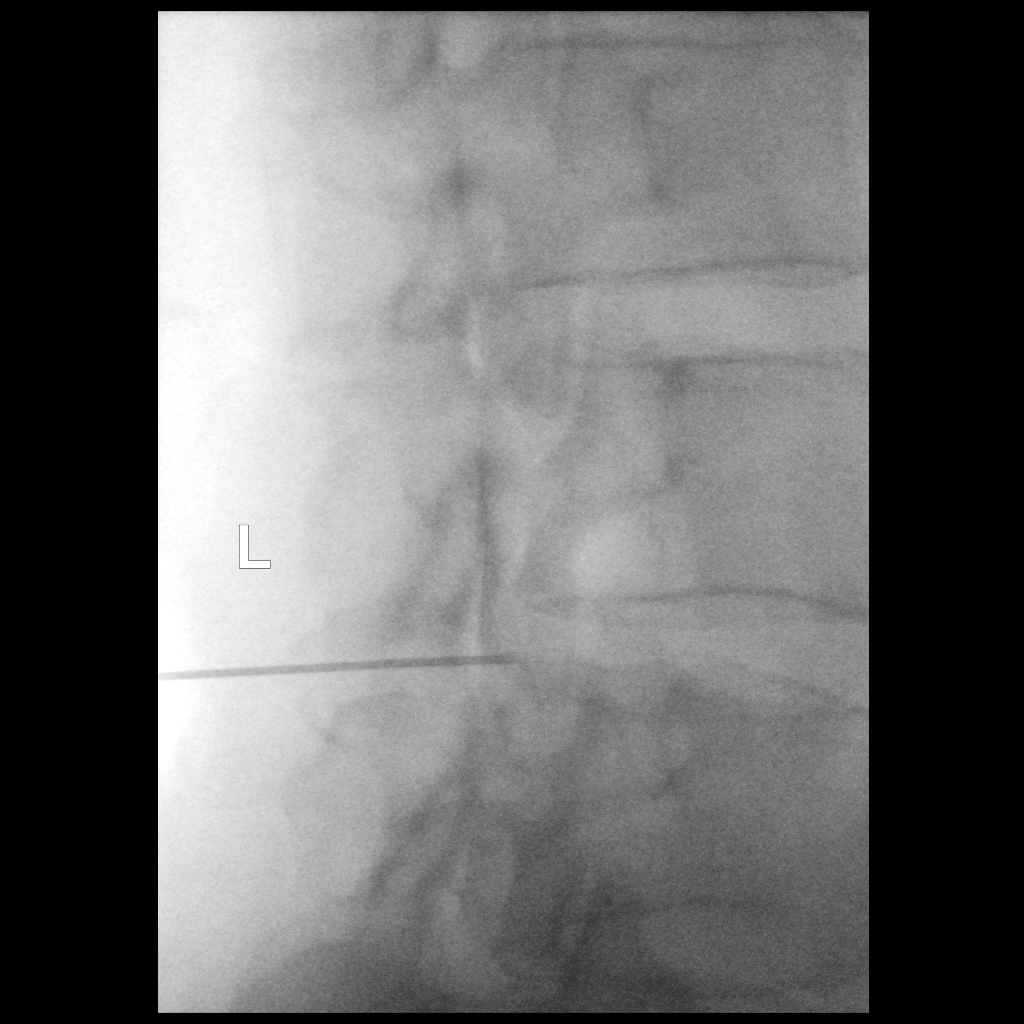

[Series 4: ortho standard · 1 of 1 slices shown (4 of 4)]
[im 1/1]
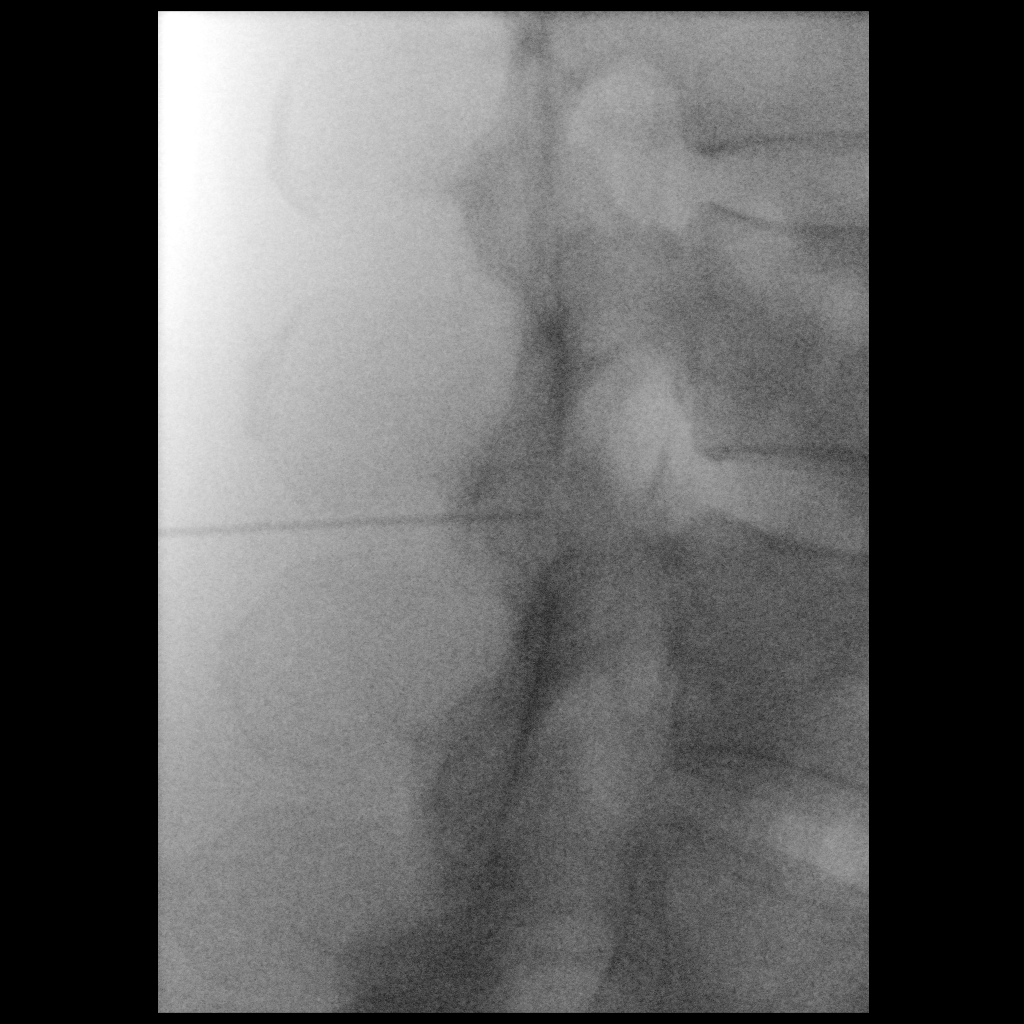

[4 of 4 positions shown; findings below may reference images not displayed]

FLUOROSCOPY TIME:  Radiation Exposure Index (as provided by the
fluoroscopic device): 2.5 mGy

Fluoroscopy Time:  21 seconds

Number of Acquired Images:  0

PROCEDURE:
LUMBAR EPIDURAL BLOOD PATCH INJECTION

After a thorough discussion of risks and benefits of the procedure,
written and verbal consent was obtained.

Prior to the procedure, 20 ml of the patient's blood was harvested
using stringent sterile technique.

A right interlaminar approach was performed at L2-L3. Under
stringent sterile technique, overlying skin was cleansed with
betadine soap and anesthetized with 1% lidocaine without
epinephrine. A 3.5 inch 20 gauge needle was advanced using
loss-of-resistance technique.

DIAGNOSTIC EPIDURAL INJECTION

Injection of Isovue-M 200 shows a good epidural pattern with spread
above and below the level of needle placement, primarily on the side
of needle placement. No vascular or subarachnoid opacification is
seen.

THERAPEUTIC EPIDURAL INJECTION

15 ml of the patient's blood was injected into the epidural space at
the site of prior lumbar puncture.
IMPRESSION: Technically successful lumbar blood patch at L2-L3.

## 2022-12-30 LAB — HM DEXA SCAN

## 2023-03-26 ENCOUNTER — Ambulatory Visit
Admission: EM | Admit: 2023-03-26 | Discharge: 2023-03-26 | Disposition: A | Discharge status: Home / Self Care | Payer: 59

## 2023-03-26 DIAGNOSIS — J0100 Acute maxillary sinusitis, unspecified: Secondary | ICD-10-CM | POA: Diagnosis not present

## 2023-03-26 DIAGNOSIS — R030 Elevated blood-pressure reading, without diagnosis of hypertension: Secondary | ICD-10-CM

## 2023-03-26 DIAGNOSIS — J029 Acute pharyngitis, unspecified: Secondary | ICD-10-CM

## 2023-03-26 DIAGNOSIS — I10 Essential (primary) hypertension: Secondary | ICD-10-CM

## 2023-03-26 HISTORY — DX: Benign intracranial hypertension: G93.2

## 2023-03-26 LAB — POCT RAPID STREP A (OFFICE): Rapid Strep A Screen: NEGATIVE

## 2023-03-26 MED ORDER — LIDOCAINE VISCOUS HCL 2 % MT SOLN: 15.0000 mL | OROMUCOSAL | 0 refills | Status: DC | PRN

## 2023-03-26 MED ORDER — AMOXICILLIN 875 MG PO TABS: 875.0000 mg | ORAL_TABLET | Freq: Two times a day (BID) | ORAL | 0 refills | Status: AC

## 2023-03-26 NOTE — Discharge Instructions (Addendum)
Take the amoxicillin as directed.  Use the viscous lidocaine as directed.  Follow up with your primary care provider if your symptoms are not improving.    Your blood pressure is elevated today at 148/96.  Please have this rechecked by your primary care provider in 2-4 weeks.    Avoid over-the-counter medications that can elevate your blood pressure.

## 2023-03-26 NOTE — ED Triage Notes (Signed)
Patient to Urgent Care with complaints of sore throat and nasal congestion that started five days ago. Reports that she has a seen a little white ulcer on the back of her throat.  Denies any known fevers. Has checked temp.   Has been taking mucinex-max/ ibuprofen.

## 2023-03-26 NOTE — ED Provider Notes (Signed)
Kelsey Pope    CSN: [id number] Arrival date & time: 03/26/23  1122      History   Chief Complaint Chief Complaint  Patient presents with   Sore Throat    Congestion this week and now have a little white ulcer on back of throat.  Really painful last night. - Entered by patient    HPI Kelsey Pope is a 52 y.o. female.  Patient presents with congestion, postnasal drip, sore throat, cough x 5-6 days.  No fever, rash, shortness of breath, or other symptoms.  Treatment attempted with ibuprofen and Mucinex.  Her medical history includes tonsillectomy and adenoidectomy.    The history is provided by the patient and medical records.    Past Medical History:  Diagnosis Date   Idiopathic intracranial hypertension    IUD (intrauterine device) in place 06/2010   Prediabetes     Patient Active Problem List   Diagnosis Date Noted   Prediabetes    ANXIETY 11/08/2007    Past Surgical History:  Procedure Laterality Date   ADENOIDECTOMY     INTRAUTERINE DEVICE (IUD) INSERTION  08/2016   Mirena    TONSILLECTOMY      OB History     Gravida  2   Para  2   Term  2   Preterm      AB      Living  2      SAB      IAB      Ectopic      Multiple      Live Births               Home Medications    Prior to Admission medications   Medication Sig Start Date End Date Taking? Authorizing Provider  amoxicillin (AMOXIL) 875 MG tablet Take 1 tablet (875 mg total) by mouth 2 (two) times daily for 10 days. 03/26/23 04/05/23 Yes Mickie Bail, NP  lidocaine (XYLOCAINE) 2 % solution Use as directed 15 mLs in the mouth or throat as needed for mouth pain (Gargle and spit out.). 03/26/23  Yes Mickie Bail, NP  progesterone (PROMETRIUM) 200 MG capsule Take 200 mg by mouth at bedtime. 03/08/23  Yes [provider]  acetaZOLAMIDE ER (DIAMOX) 500 MG capsule Take 1 capsule (500 mg total) by mouth 2 (two) times daily. Patient not taking: Reported on 08/19/2021  04/29/21   Drema Dallas, DO  Ascorbic Acid (VITAMIN C PO) Take by mouth.    [provider]  Calcium-Vitamin D-Vitamin K (CHEWABLE CALCIUM PO) Take by mouth.    [provider]  hydrochlorothiazide (HYDRODIURIL) 12.5 MG tablet Take 12.5 mg by mouth daily. 01/22/21   [provider]  levonorgestrel (MIRENA) 20 MCG/24HR IUD 1 each by Intrauterine route once.    [provider]  Multiple Vitamins-Minerals (MULTIVITAMIN PO) Take by mouth.    [provider]  Vitamin D, Ergocalciferol, (DRISDOL) 1.25 MG (50000 UNIT) CAPS capsule Take 1 capsule by mouth once a week. 08/21/20   [provider]    Family History Family History  Problem Relation Age of Onset   Breast cancer Maternal Aunt 37       w/ menopause   Breast cancer Maternal Grandmother 22    Social History Social History   Tobacco Use   Smoking status: Never   Smokeless tobacco: Never  Substance Use Topics   Alcohol use: Yes    Alcohol/week: 1.0 standard drink of alcohol  Types: 1 Standard drinks or equivalent per week   Drug use: No     Allergies   Codeine   Review of Systems Review of Systems  Constitutional:  Negative for chills and fever.  HENT:  Positive for congestion, postnasal drip, rhinorrhea and sore throat. Negative for ear pain.   Respiratory:  Positive for cough. Negative for shortness of breath.   Cardiovascular:  Negative for chest pain and palpitations.  Gastrointestinal:  Negative for diarrhea and vomiting.  Skin:  Negative for rash.     Physical Exam Triage Vital Signs ED Triage Vitals  Enc Vitals Group     BP      Pulse      Resp      Temp      Temp src      SpO2      Weight      Height      Head Circumference      Peak Flow      Pain Score      Pain Loc      Pain Edu?      Excl. in GC?    No data found.  Updated Vital Signs BP (!) 148/96   Pulse 85   Temp 98.7 F (37.1 C)   Resp 18   SpO2 97%   Visual Acuity Right  Eye Distance:   Left Eye Distance:   Bilateral Distance:    Right Eye Near:   Left Eye Near:    Bilateral Near:     Physical Exam Constitutional:      General: She is not in acute distress. HENT:     Right Ear: Tympanic membrane normal.     Left Ear: Tympanic membrane normal.     Nose: Congestion and rhinorrhea present.     Mouth/Throat:     Mouth: Mucous membranes are moist.     Pharynx: Posterior oropharyngeal erythema present.  Cardiovascular:     Rate and Rhythm: Normal rate and regular rhythm.     Heart sounds: Normal heart sounds.  Pulmonary:     Effort: Pulmonary effort is normal. No respiratory distress.     Breath sounds: Normal breath sounds.  Skin:    General: Skin is warm and dry.  Neurological:     Mental Status: She is alert.  Psychiatric:        Mood and Affect: Mood normal.        Behavior: Behavior normal.      UC Treatments / Results  Labs (all labs ordered are listed, but only abnormal results are displayed) Labs Reviewed  POCT RAPID STREP A (OFFICE)    EKG   Radiology No results found.  Procedures Procedures (including critical care time)  Medications Ordered in UC Medications - No data to display  Initial Impression / Assessment and Plan / UC Course  I have reviewed the triage vital signs and the nursing notes.  Pertinent labs & imaging results that were available during my care of the patient were reviewed by me and considered in my medical decision making (see chart for details).    Acute sinusitis, acute pharyngitis.  Elevated blood pressure reading.  Patient reports she has not taken her blood pressure medication yet today.  She also has been taking OTC cold medication.  Discussed with patient that her blood pressure is elevated today and needs to be rechecked by PCP in 2 to 4 weeks.  Education provided on managing hypertension.  Instructed her to  avoid OTC medications that can elevated blood pressure.  Treating her sinusitis with  amoxicillin.  Treating her sore throat with viscous lidocaine.  Instructed her to follow-up with her PCP if her symptoms are not improving.  She agrees to plan of care.  Final Clinical Impressions(s) / UC Diagnoses   Final diagnoses:  Acute non-recurrent maxillary sinusitis  Acute pharyngitis, unspecified etiology  Elevated blood pressure reading     Discharge Instructions      Take the amoxicillin as directed.  Use the viscous lidocaine as directed.  Follow up with your primary care provider if your symptoms are not improving.    Your blood pressure is elevated today at 148/96.  Please have this rechecked by your primary care provider in 2-4 weeks.    Avoid over-the-counter medications that can elevate your blood pressure.      ED Prescriptions     Medication Sig Dispense Auth. Provider   amoxicillin (AMOXIL) 875 MG tablet Take 1 tablet (875 mg total) by mouth 2 (two) times daily for 10 days. 20 tablet Mickie Bail, NP   lidocaine (XYLOCAINE) 2 % solution Use as directed 15 mLs in the mouth or throat as needed for mouth pain (Gargle and spit out.). 100 mL Mickie Bail, NP      PDMP not reviewed this encounter.   Mickie Bail, NP 03/26/23 1159

## 2023-12-27 LAB — HM PAP SMEAR

## 2023-12-28 LAB — LAB REPORT - SCANNED
A1c: 5.8
EGFR (Non-African Amer.): 94
TSH: 0.57 (ref 0.41–5.90)

## 2024-02-13 LAB — COLOGUARD: Cologuard: NEGATIVE

## 2024-02-20 LAB — HM MAMMOGRAPHY

## 2024-03-14 ENCOUNTER — Encounter: Payer: Self-pay | Admitting: Family Medicine

## 2024-03-14 ENCOUNTER — Ambulatory Visit (INDEPENDENT_AMBULATORY_CARE_PROVIDER_SITE_OTHER): Payer: 59 | Admitting: Family Medicine

## 2024-03-14 ENCOUNTER — Ambulatory Visit: Payer: Self-pay | Admitting: Family Medicine

## 2024-03-14 VITALS — BP 126/80 | HR 71 | Temp 97.8°F | Ht 60.0 in | Wt 157.4 lb

## 2024-03-14 DIAGNOSIS — E66811 Obesity, class 1: Secondary | ICD-10-CM

## 2024-03-14 DIAGNOSIS — E785 Hyperlipidemia, unspecified: Secondary | ICD-10-CM | POA: Diagnosis not present

## 2024-03-14 DIAGNOSIS — Z8249 Family history of ischemic heart disease and other diseases of the circulatory system: Secondary | ICD-10-CM | POA: Diagnosis not present

## 2024-03-14 DIAGNOSIS — N951 Menopausal and female climacteric states: Secondary | ICD-10-CM | POA: Insufficient documentation

## 2024-03-14 DIAGNOSIS — Z6830 Body mass index (BMI) 30.0-30.9, adult: Secondary | ICD-10-CM

## 2024-03-14 DIAGNOSIS — G932 Benign intracranial hypertension: Secondary | ICD-10-CM | POA: Insufficient documentation

## 2024-03-14 DIAGNOSIS — E6609 Other obesity due to excess calories: Secondary | ICD-10-CM | POA: Insufficient documentation

## 2024-03-14 DIAGNOSIS — M8580 Other specified disorders of bone density and structure, unspecified site: Secondary | ICD-10-CM | POA: Insufficient documentation

## 2024-03-14 DIAGNOSIS — R7303 Prediabetes: Secondary | ICD-10-CM

## 2024-03-14 LAB — COMPREHENSIVE METABOLIC PANEL WITH GFR
ALT: 21 U/L (ref 0–35)
AST: 26 U/L (ref 0–37)
Albumin: 4.4 g/dL (ref 3.5–5.2)
Alkaline Phosphatase: 67 U/L (ref 39–117)
BUN: 14 mg/dL (ref 6–23)
CO2: 29 meq/L (ref 19–32)
Calcium: 10 mg/dL (ref 8.4–10.5)
Chloride: 100 meq/L (ref 96–112)
Creatinine, Ser: 0.84 mg/dL (ref 0.40–1.20)
GFR: 79.05 mL/min (ref >60.00)
Glucose, Bld: 118 mg/dL — ABNORMAL HIGH (ref 70–99)
Potassium: 3.9 meq/L (ref 3.5–5.1)
Sodium: 137 meq/L (ref 135–145)
Total Bilirubin: 0.6 mg/dL (ref 0.2–1.2)
Total Protein: 7.1 g/dL (ref 6.0–8.3)

## 2024-03-14 LAB — LIPID PANEL
Cholesterol: 187 mg/dL (ref 0–200)
HDL: 59.9 mg/dL (ref >39.00)
LDL Cholesterol: 108 mg/dL — ABNORMAL HIGH (ref 0–99)
NonHDL: 127.06
Total CHOL/HDL Ratio: 3
Triglycerides: 93 mg/dL (ref 0.0–149.0)
VLDL: 18.6 mg/dL (ref 0.0–40.0)

## 2024-03-14 LAB — HEMOGLOBIN A1C: Hgb A1c MFr Bld: 5.7 % (ref 4.6–6.5)

## 2024-03-14 NOTE — Progress Notes (Signed)
 Subjective:    Patient ID: Kelsey Pope, female    DOB: 1971/03/22, 53 y.o.   MRN: [id number]  HPI  Wt Readings from Last 3 Encounters:  03/14/24 157 lb 6.4 oz (71.4 kg)  08/19/21 160 lb 3.2 oz (72.7 kg)  02/13/21 163 lb 9.6 oz (74.2 kg)   30.74 kg/m  Vitals:   03/14/24 0849  BP: 126/80  Pulse: 71  Temp: 97.8 F (36.6 C)  SpO2: 99%    Pt presents to establish for primary care  Wants to discuss preventative medicine  Sees gyn/no regular pcp recently     Sees neurology for idiopathic intracranial hypertension  Dr Lucila Rye regular eye checks Pressure is back to normal -doing better now   Former smoker - quit 2011  Smoked off /on as young adult     Sees gyn -sees Dr Raelene Bullocks in [place]  Takes oral estradiol 1 mg daily  Progesterone 200 mg  Is in menopause now Last period was 2 years ago  Has mirena  iud   Pap - this year with Dr Raelene Bullocks and normal   Mammogram - goes to [facility] imaging / last month was normal 02/20/24 Self breast exam   Shingrix  Colon cancer screening - cologuard in the past (ordered by gyn) - over a year ago   Tetanus shot was 2017  Prediabetes Lab Results  Component Value Date   HGBA1C 5.5 08/03/2017   HGBA1C 5.1 08/12/2016   HGBA1C 5.7 (H) 05/28/2014   Does get labs at gyn   ? Last cholesterol   Exercise Walks at least 3 times weekly for 3 or more miles  Just started a work out video with [employer] - started a 21 day program   Is a fair eater  Pays attention  Some sweets- a little candy after dinner   Interested in cardiac  screening  Father had CAD in his 42s   Thinks she has mild hyperlipidemia Wants cardiac calcium score   Has osteopenia  Taking ca and D        No data to display             No data to display             Patient Active Problem List   Diagnosis Date Noted   Idiopathic intracranial hypertension 03/14/2024   Mild hyperlipidemia 03/14/2024   Family history of early CAD  03/14/2024   Osteopenia 03/14/2024   Menopausal state 03/14/2024   Prediabetes    Past Medical History:  Diagnosis Date   Idiopathic intracranial hypertension    IUD (intrauterine device) in place 06/2010   Prediabetes    Past Surgical History:  Procedure Laterality Date   ADENOIDECTOMY     INTRAUTERINE DEVICE (IUD) INSERTION  08/2016   Mirena     TONSILLECTOMY     Social History   Tobacco Use   Smoking status: Never   Smokeless tobacco: Never  Substance Use Topics   Alcohol use: Yes    Alcohol/week: 1.0 standard drink of alcohol    Types: 1 Standard drinks or equivalent per week   Drug use: No   Family History  Problem Relation Age of Onset   COPD Father    Breast cancer Maternal Grandmother 81   Breast cancer Maternal Aunt 29       w/ menopause   Allergies  Allergen Reactions   Codeine     REACTION: hyper   Current Outpatient Medications on File  Prior to Visit  Medication Sig Dispense Refill   Ascorbic Acid (VITAMIN C PO) Take by mouth.     Calcium-Vitamin D -Vitamin K (CHEWABLE CALCIUM PO) Take by mouth.     estradiol (ESTRACE) 1 MG tablet Take 1 mg by mouth at bedtime.     hydrochlorothiazide (HYDRODIURIL) 12.5 MG tablet Take 25 mg by mouth daily.     levonorgestrel  (MIRENA ) 20 MCG/24HR IUD 1 each by Intrauterine route once.     Multiple Vitamins-Minerals (MULTIVITAMIN PO) Take by mouth.     progesterone (PROMETRIUM) 200 MG capsule Take 200 mg by mouth at bedtime.     Vitamin D , Ergocalciferol , (DRISDOL) 1.25 MG (50000 UNIT) CAPS capsule Take 1 capsule by mouth once a week.     No current facility-administered medications on file prior to visit.    Review of Systems  Constitutional:  Negative for activity change, appetite change, fatigue, fever and unexpected weight change.  HENT:  Negative for congestion, ear pain, rhinorrhea, sinus pressure and sore throat.   Eyes:  Negative for pain, redness and visual disturbance.  Respiratory:  Negative for cough,  shortness of breath and wheezing.   Cardiovascular:  Negative for chest pain and palpitations.  Gastrointestinal:  Negative for abdominal pain, blood in stool, constipation and diarrhea.  Endocrine: Negative for polydipsia and polyuria.  Genitourinary:  Negative for dysuria, frequency and urgency.  Musculoskeletal:  Negative for arthralgias, back pain and myalgias.  Skin:  Negative for pallor and rash.  Allergic/Immunologic: Negative for environmental allergies.  Neurological:  Negative for dizziness, syncope and headaches.  Hematological:  Negative for adenopathy. Does not bruise/bleed easily.  Psychiatric/Behavioral:  Negative for decreased concentration and dysphoric mood. The patient is not nervous/anxious.        Objective:   Physical Exam Constitutional:      General: She is not in acute distress.    Appearance: Normal appearance. She is well-developed. She is obese. She is not ill-appearing or diaphoretic.  HENT:     Head: Normocephalic and atraumatic.  Eyes:     Conjunctiva/sclera: Conjunctivae normal.     Pupils: Pupils are equal, round, and reactive to light.  Neck:     Thyroid: No thyromegaly.     Vascular: No carotid bruit or JVD.  Cardiovascular:     Rate and Rhythm: Normal rate and regular rhythm.     Heart sounds: Normal heart sounds.     No gallop.  Pulmonary:     Effort: Pulmonary effort is normal. No respiratory distress.     Breath sounds: Normal breath sounds. No wheezing or rales.  Abdominal:     General: There is no distension or abdominal bruit.     Palpations: Abdomen is soft.  Musculoskeletal:     Cervical back: Normal range of motion and neck supple.     Right lower leg: No edema.     Left lower leg: No edema.  Lymphadenopathy:     Cervical: No cervical adenopathy.  Skin:    General: Skin is warm and dry.     Coloration: Skin is not pale.     Findings: No rash.  Neurological:     Mental Status: She is alert.     Coordination: Coordination  normal.     Deep Tendon Reflexes: Reflexes are normal and symmetric. Reflexes normal.  Psychiatric:        Mood and Affect: Mood normal.           Assessment & Plan:   Problem List  Items Addressed This Visit       Nervous and Auditory   Idiopathic intracranial hypertension   Sees neuro Dr Dotty Gee Doing well Per pt headaches controlled and pressure is normal  Sees oph as well  Hydrochlorothiazide is on medicine list         Musculoskeletal and Integument   Osteopenia   Sent for dexa from gyn office Takes D 50,000 international units weekly  Also calcium and vitamin K  Discussed fall prevention, supplements and exercise for bone density          Other   Prediabetes   Per pt in past  Watching diet but does eat some candy Just started a new work out program  A1c ordered       Relevant Orders   Hemoglobin A1c   Mild hyperlipidemia - Primary   Labs today  Fam history of early CAD in father Sent for last labs  Lipid panel today Encouraged diet low in trans/sat fats       Relevant Orders   CT CARDIAC SCORING (SELF PAY ONLY)   Comprehensive metabolic panel with GFR   Lipid panel   Menopausal state   Pt sees Dr Raelene Bullocks for gyn On oral estradiol 1 mg  Progesterone 200 mg daily  Still has mirena  iud as well Helps symptoms  Has had dexa - per pt osteopenia   Sent for records      Family history of early CAD   Interested in cardiac ca score Ordered test Also lipids today Good blood pressure  Working on diet and exercise      Relevant Orders   CT CARDIAC SCORING (SELF PAY ONLY)

## 2024-03-14 NOTE — Assessment & Plan Note (Signed)
 Bmi of 30.7  History of prediabetes  Discussed how this problem influences overall health and the risks it imposes  Reviewed plan for weight loss with lower calorie diet (via better food choices (lower glycemic and portion control) along with exercise building up to or more than 30 minutes 5 days per week including some aerobic activity and strength training

## 2024-03-14 NOTE — Assessment & Plan Note (Signed)
 Interested in cardiac ca score Ordered test Also lipids today Good blood pressure  Working on diet and exercise

## 2024-03-14 NOTE — Patient Instructions (Addendum)
 Keep walking  Add some strength training to your routine, this is important for bone and brain health and can reduce your risk of falls and help your body use insulin properly and regulate weight  Light weights, exercise bands , and internet videos are a good way to start  Yoga (chair or regular), machines , floor exercises or a gym with machines are also good options   Try to get most of your carbohydrates from produce (with the exception of white potatoes) and whole grains Eat less bread/pasta/rice/snack foods/cereals/sweets and other items from the middle of the grocery store (processed carbs)  Then eat lean protein  The following are examples of protein in diet  Meat -lean  Fish  Eggs  Dairy products  Soy products  Oat milk  Almond milk Nuts and nut butters  Legumes  Dried beans    If you are interested in the shingles vaccine series (Shingrix), call your insurance or pharmacy to check on coverage and location it must be given.  If affordable - you can schedule it here or at your pharmacy depending on coverage    I put the referral in for a cardiac calcium scan  Please let us  know if you don't hear in 1-2 weeks to set that up  Labs today

## 2024-03-14 NOTE — Assessment & Plan Note (Signed)
 Per pt in past  Watching diet but does eat some candy Just started a new work out program  A1c ordered

## 2024-03-14 NOTE — Assessment & Plan Note (Signed)
 Sent for dexa from gyn office Takes D 50,000 international units weekly  Also calcium and vitamin K  Discussed fall prevention, supplements and exercise for bone density

## 2024-03-14 NOTE — Assessment & Plan Note (Addendum)
 Pt sees Dr Raelene Bullocks for gyn On oral estradiol 1 mg  Progesterone 200 mg daily  Still has mirena  iud as well Helps symptoms  Has had dexa - per pt osteopenia   Sent for records

## 2024-03-14 NOTE — Assessment & Plan Note (Signed)
 Sees neuro Dr Dotty Gee Doing well Per pt headaches controlled and pressure is normal  Sees oph as well  Hydrochlorothiazide is on medicine list

## 2024-03-14 NOTE — Assessment & Plan Note (Signed)
 Labs today  Fam history of early CAD in father Sent for last labs  Lipid panel today Encouraged diet low in trans/sat fats

## 2024-03-26 ENCOUNTER — Encounter: Payer: Self-pay | Admitting: Obstetrics and Gynecology

## 2024-04-24 ENCOUNTER — Ambulatory Visit
Admission: RE | Admit: 2024-04-24 | Discharge: 2024-04-24 | Disposition: A | Discharge status: Home / Self Care | Payer: Self-pay | Source: Ambulatory Visit | Attending: Family Medicine | Admitting: Family Medicine

## 2024-04-24 DIAGNOSIS — E785 Hyperlipidemia, unspecified: Secondary | ICD-10-CM | POA: Insufficient documentation

## 2024-04-24 DIAGNOSIS — Z8249 Family history of ischemic heart disease and other diseases of the circulatory system: Secondary | ICD-10-CM | POA: Insufficient documentation
